# Patient Record
Sex: Female | Born: 1945 | Race: White | Hispanic: No | Marital: Married | Smoking: Never smoker
Health system: Southern US, Community
[De-identification: ages and names within clinical notes are randomized; demographics above are authoritative.]

## PROBLEM LIST (undated history)

## (undated) DIAGNOSIS — I6529 Occlusion and stenosis of unspecified carotid artery: Secondary | ICD-10-CM

## (undated) DIAGNOSIS — I639 Cerebral infarction, unspecified: Secondary | ICD-10-CM

## (undated) DIAGNOSIS — E119 Type 2 diabetes mellitus without complications: Secondary | ICD-10-CM

## (undated) HISTORY — PX: COLONOSCOPY: SHX174

## (undated) HISTORY — DX: Occlusion and stenosis of unspecified carotid artery: I65.29

---

## 1999-06-05 ENCOUNTER — Encounter: Payer: Self-pay | Admitting: Family Medicine

## 1999-06-05 ENCOUNTER — Ambulatory Visit (HOSPITAL_COMMUNITY): Admission: RE | Admit: 1999-06-05 | Discharge: 1999-06-05 | Payer: Self-pay | Admitting: Family Medicine

## 2005-06-08 ENCOUNTER — Other Ambulatory Visit: Admission: RE | Admit: 2005-06-08 | Discharge: 2005-06-08 | Payer: Self-pay | Admitting: Family Medicine

## 2005-06-15 ENCOUNTER — Ambulatory Visit (HOSPITAL_COMMUNITY): Admission: RE | Admit: 2005-06-15 | Discharge: 2005-06-15 | Payer: Self-pay | Admitting: Family Medicine

## 2006-11-09 ENCOUNTER — Ambulatory Visit (HOSPITAL_COMMUNITY): Admission: RE | Admit: 2006-11-09 | Discharge: 2006-11-09 | Payer: Self-pay | Admitting: Family Medicine

## 2006-11-23 ENCOUNTER — Encounter: Admission: RE | Admit: 2006-11-23 | Discharge: 2006-11-23 | Payer: Self-pay | Admitting: Family Medicine

## 2007-01-25 ENCOUNTER — Other Ambulatory Visit: Admission: RE | Admit: 2007-01-25 | Discharge: 2007-01-25 | Payer: Self-pay | Admitting: Family Medicine

## 2009-09-25 ENCOUNTER — Encounter: Admission: RE | Admit: 2009-09-25 | Discharge: 2009-09-25 | Payer: Self-pay | Admitting: Family Medicine

## 2009-10-28 ENCOUNTER — Other Ambulatory Visit: Admission: RE | Admit: 2009-10-28 | Discharge: 2009-10-28 | Payer: Self-pay | Admitting: Family Medicine

## 2009-11-17 ENCOUNTER — Encounter: Admission: RE | Admit: 2009-11-17 | Discharge: 2009-11-17 | Payer: Self-pay | Admitting: Family Medicine

## 2012-04-28 ENCOUNTER — Other Ambulatory Visit (HOSPITAL_COMMUNITY): Payer: Self-pay | Admitting: Family Medicine

## 2012-04-28 DIAGNOSIS — Z1231 Encounter for screening mammogram for malignant neoplasm of breast: Secondary | ICD-10-CM

## 2012-05-16 ENCOUNTER — Ambulatory Visit (HOSPITAL_COMMUNITY)
Admission: RE | Admit: 2012-05-16 | Discharge: 2012-05-16 | Disposition: A | Discharge status: Home / Self Care | Payer: BC Managed Care – PPO | Source: Ambulatory Visit | Attending: Family Medicine | Admitting: Family Medicine

## 2012-05-16 DIAGNOSIS — Z1231 Encounter for screening mammogram for malignant neoplasm of breast: Secondary | ICD-10-CM

## 2013-06-25 ENCOUNTER — Other Ambulatory Visit (HOSPITAL_COMMUNITY)
Admission: RE | Admit: 2013-06-25 | Discharge: 2013-06-25 | Disposition: A | Discharge status: Home / Self Care | Payer: Medicare Other | Source: Ambulatory Visit | Attending: Family Medicine | Admitting: Family Medicine

## 2013-06-25 ENCOUNTER — Other Ambulatory Visit: Payer: Self-pay | Admitting: Family Medicine

## 2013-06-25 DIAGNOSIS — Z124 Encounter for screening for malignant neoplasm of cervix: Secondary | ICD-10-CM | POA: Insufficient documentation

## 2015-11-14 DIAGNOSIS — W57XXXA Bitten or stung by nonvenomous insect and other nonvenomous arthropods, initial encounter: Secondary | ICD-10-CM | POA: Diagnosis not present

## 2015-11-14 DIAGNOSIS — R197 Diarrhea, unspecified: Secondary | ICD-10-CM | POA: Diagnosis not present

## 2016-01-29 DIAGNOSIS — E119 Type 2 diabetes mellitus without complications: Secondary | ICD-10-CM | POA: Diagnosis not present

## 2016-01-29 DIAGNOSIS — Z7984 Long term (current) use of oral hypoglycemic drugs: Secondary | ICD-10-CM | POA: Diagnosis not present

## 2016-01-30 DIAGNOSIS — Z7984 Long term (current) use of oral hypoglycemic drugs: Secondary | ICD-10-CM | POA: Diagnosis not present

## 2016-01-30 DIAGNOSIS — E119 Type 2 diabetes mellitus without complications: Secondary | ICD-10-CM | POA: Diagnosis not present

## 2016-03-08 DIAGNOSIS — E119 Type 2 diabetes mellitus without complications: Secondary | ICD-10-CM | POA: Diagnosis not present

## 2016-03-08 DIAGNOSIS — Z7984 Long term (current) use of oral hypoglycemic drugs: Secondary | ICD-10-CM | POA: Diagnosis not present

## 2016-05-03 DIAGNOSIS — E119 Type 2 diabetes mellitus without complications: Secondary | ICD-10-CM | POA: Diagnosis not present

## 2016-05-03 DIAGNOSIS — Z7984 Long term (current) use of oral hypoglycemic drugs: Secondary | ICD-10-CM | POA: Diagnosis not present

## 2016-09-15 DIAGNOSIS — Z6838 Body mass index (BMI) 38.0-38.9, adult: Secondary | ICD-10-CM | POA: Diagnosis not present

## 2016-09-15 DIAGNOSIS — Z0000 Encounter for general adult medical examination without abnormal findings: Secondary | ICD-10-CM | POA: Diagnosis not present

## 2016-09-15 DIAGNOSIS — E119 Type 2 diabetes mellitus without complications: Secondary | ICD-10-CM | POA: Diagnosis not present

## 2016-09-15 DIAGNOSIS — R030 Elevated blood-pressure reading, without diagnosis of hypertension: Secondary | ICD-10-CM | POA: Diagnosis not present

## 2017-03-29 DIAGNOSIS — W57XXXA Bitten or stung by nonvenomous insect and other nonvenomous arthropods, initial encounter: Secondary | ICD-10-CM | POA: Diagnosis not present

## 2017-03-29 DIAGNOSIS — S80862A Insect bite (nonvenomous), left lower leg, initial encounter: Secondary | ICD-10-CM | POA: Diagnosis not present

## 2017-04-06 DIAGNOSIS — Z6838 Body mass index (BMI) 38.0-38.9, adult: Secondary | ICD-10-CM | POA: Diagnosis not present

## 2017-04-06 DIAGNOSIS — E119 Type 2 diabetes mellitus without complications: Secondary | ICD-10-CM | POA: Diagnosis not present

## 2017-04-06 DIAGNOSIS — Z7984 Long term (current) use of oral hypoglycemic drugs: Secondary | ICD-10-CM | POA: Diagnosis not present

## 2017-04-06 DIAGNOSIS — R030 Elevated blood-pressure reading, without diagnosis of hypertension: Secondary | ICD-10-CM | POA: Diagnosis not present

## 2017-08-29 DIAGNOSIS — R05 Cough: Secondary | ICD-10-CM | POA: Diagnosis not present

## 2017-09-30 DIAGNOSIS — Z0000 Encounter for general adult medical examination without abnormal findings: Secondary | ICD-10-CM | POA: Diagnosis not present

## 2017-09-30 DIAGNOSIS — L304 Erythema intertrigo: Secondary | ICD-10-CM | POA: Diagnosis not present

## 2017-09-30 DIAGNOSIS — E119 Type 2 diabetes mellitus without complications: Secondary | ICD-10-CM | POA: Diagnosis not present

## 2017-09-30 DIAGNOSIS — Z6841 Body Mass Index (BMI) 40.0 and over, adult: Secondary | ICD-10-CM | POA: Diagnosis not present

## 2017-09-30 DIAGNOSIS — Z7984 Long term (current) use of oral hypoglycemic drugs: Secondary | ICD-10-CM | POA: Diagnosis not present

## 2017-09-30 DIAGNOSIS — R030 Elevated blood-pressure reading, without diagnosis of hypertension: Secondary | ICD-10-CM | POA: Diagnosis not present

## 2017-10-25 DIAGNOSIS — D126 Benign neoplasm of colon, unspecified: Secondary | ICD-10-CM | POA: Diagnosis not present

## 2017-10-25 DIAGNOSIS — Z800 Family history of malignant neoplasm of digestive organs: Secondary | ICD-10-CM | POA: Diagnosis not present

## 2017-10-27 ENCOUNTER — Other Ambulatory Visit: Payer: Self-pay | Admitting: Family Medicine

## 2017-10-27 DIAGNOSIS — Z1231 Encounter for screening mammogram for malignant neoplasm of breast: Secondary | ICD-10-CM

## 2017-11-09 DIAGNOSIS — Z7984 Long term (current) use of oral hypoglycemic drugs: Secondary | ICD-10-CM | POA: Diagnosis not present

## 2017-11-09 DIAGNOSIS — H2513 Age-related nuclear cataract, bilateral: Secondary | ICD-10-CM | POA: Diagnosis not present

## 2017-11-09 DIAGNOSIS — E119 Type 2 diabetes mellitus without complications: Secondary | ICD-10-CM | POA: Diagnosis not present

## 2017-11-09 DIAGNOSIS — H25013 Cortical age-related cataract, bilateral: Secondary | ICD-10-CM | POA: Diagnosis not present

## 2017-11-16 ENCOUNTER — Ambulatory Visit
Admission: RE | Admit: 2017-11-16 | Discharge: 2017-11-16 | Disposition: A | Discharge status: Home / Self Care | Payer: PPO | Source: Ambulatory Visit | Attending: Family Medicine | Admitting: Family Medicine

## 2017-11-16 DIAGNOSIS — Z1231 Encounter for screening mammogram for malignant neoplasm of breast: Secondary | ICD-10-CM | POA: Diagnosis not present

## 2017-12-07 DIAGNOSIS — K5730 Diverticulosis of large intestine without perforation or abscess without bleeding: Secondary | ICD-10-CM | POA: Diagnosis not present

## 2017-12-07 DIAGNOSIS — D126 Benign neoplasm of colon, unspecified: Secondary | ICD-10-CM | POA: Diagnosis not present

## 2017-12-07 DIAGNOSIS — Z86010 Personal history of colonic polyps: Secondary | ICD-10-CM | POA: Diagnosis not present

## 2017-12-13 DIAGNOSIS — D126 Benign neoplasm of colon, unspecified: Secondary | ICD-10-CM | POA: Diagnosis not present

## 2018-04-07 DIAGNOSIS — E119 Type 2 diabetes mellitus without complications: Secondary | ICD-10-CM | POA: Diagnosis not present

## 2018-04-07 DIAGNOSIS — Z23 Encounter for immunization: Secondary | ICD-10-CM | POA: Diagnosis not present

## 2018-04-07 DIAGNOSIS — R030 Elevated blood-pressure reading, without diagnosis of hypertension: Secondary | ICD-10-CM | POA: Diagnosis not present

## 2018-04-07 DIAGNOSIS — Z7984 Long term (current) use of oral hypoglycemic drugs: Secondary | ICD-10-CM | POA: Diagnosis not present

## 2018-10-25 DIAGNOSIS — E119 Type 2 diabetes mellitus without complications: Secondary | ICD-10-CM | POA: Diagnosis not present

## 2018-10-25 DIAGNOSIS — Z0000 Encounter for general adult medical examination without abnormal findings: Secondary | ICD-10-CM | POA: Diagnosis not present

## 2018-10-25 DIAGNOSIS — R030 Elevated blood-pressure reading, without diagnosis of hypertension: Secondary | ICD-10-CM | POA: Diagnosis not present

## 2018-10-25 DIAGNOSIS — Z6841 Body Mass Index (BMI) 40.0 and over, adult: Secondary | ICD-10-CM | POA: Diagnosis not present

## 2018-11-22 DIAGNOSIS — Z7984 Long term (current) use of oral hypoglycemic drugs: Secondary | ICD-10-CM | POA: Diagnosis not present

## 2018-11-22 DIAGNOSIS — H2513 Age-related nuclear cataract, bilateral: Secondary | ICD-10-CM | POA: Diagnosis not present

## 2018-11-22 DIAGNOSIS — E119 Type 2 diabetes mellitus without complications: Secondary | ICD-10-CM | POA: Diagnosis not present

## 2018-11-22 DIAGNOSIS — H52203 Unspecified astigmatism, bilateral: Secondary | ICD-10-CM | POA: Diagnosis not present

## 2018-11-22 DIAGNOSIS — H25013 Cortical age-related cataract, bilateral: Secondary | ICD-10-CM | POA: Diagnosis not present

## 2018-11-22 DIAGNOSIS — H524 Presbyopia: Secondary | ICD-10-CM | POA: Diagnosis not present

## 2019-04-25 DIAGNOSIS — Z7984 Long term (current) use of oral hypoglycemic drugs: Secondary | ICD-10-CM | POA: Diagnosis not present

## 2019-04-25 DIAGNOSIS — Z6839 Body mass index (BMI) 39.0-39.9, adult: Secondary | ICD-10-CM | POA: Diagnosis not present

## 2019-04-25 DIAGNOSIS — E785 Hyperlipidemia, unspecified: Secondary | ICD-10-CM | POA: Diagnosis not present

## 2019-04-25 DIAGNOSIS — I10 Essential (primary) hypertension: Secondary | ICD-10-CM | POA: Diagnosis not present

## 2019-04-25 DIAGNOSIS — E119 Type 2 diabetes mellitus without complications: Secondary | ICD-10-CM | POA: Diagnosis not present

## 2019-11-07 DIAGNOSIS — Z0000 Encounter for general adult medical examination without abnormal findings: Secondary | ICD-10-CM | POA: Diagnosis not present

## 2019-11-07 DIAGNOSIS — R030 Elevated blood-pressure reading, without diagnosis of hypertension: Secondary | ICD-10-CM | POA: Diagnosis not present

## 2019-11-07 DIAGNOSIS — Z6841 Body Mass Index (BMI) 40.0 and over, adult: Secondary | ICD-10-CM | POA: Diagnosis not present

## 2019-11-07 DIAGNOSIS — E119 Type 2 diabetes mellitus without complications: Secondary | ICD-10-CM | POA: Diagnosis not present

## 2019-11-28 DIAGNOSIS — H25813 Combined forms of age-related cataract, bilateral: Secondary | ICD-10-CM | POA: Diagnosis not present

## 2019-11-28 DIAGNOSIS — H52203 Unspecified astigmatism, bilateral: Secondary | ICD-10-CM | POA: Diagnosis not present

## 2019-11-28 DIAGNOSIS — H524 Presbyopia: Secondary | ICD-10-CM | POA: Diagnosis not present

## 2019-11-28 DIAGNOSIS — E1136 Type 2 diabetes mellitus with diabetic cataract: Secondary | ICD-10-CM | POA: Diagnosis not present

## 2020-02-15 IMAGING — MG DIGITAL SCREENING BILATERAL MAMMOGRAM WITH TOMO AND CAD
8 series · 8 of 24 positions shown · non-contrast
Comparison: Previous exam(s).

CLINICAL DATA: Screening.

EXAM:
DIGITAL SCREENING BILATERAL MAMMOGRAM WITH TOMO AND CAD

[L MLO synth-2D]
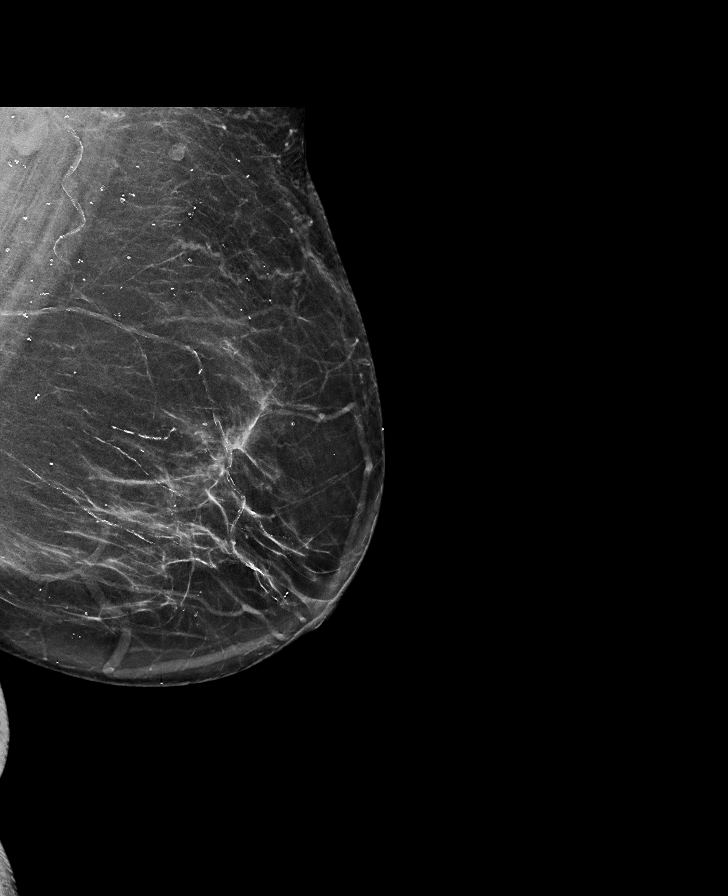

[R MLO synth-2D]
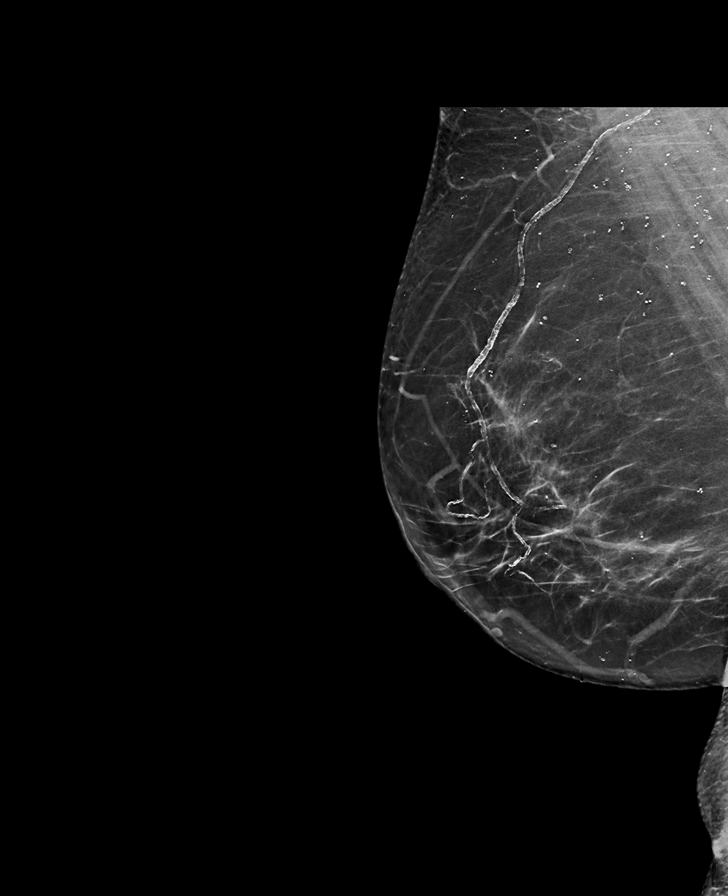

[R CC synth-2D]
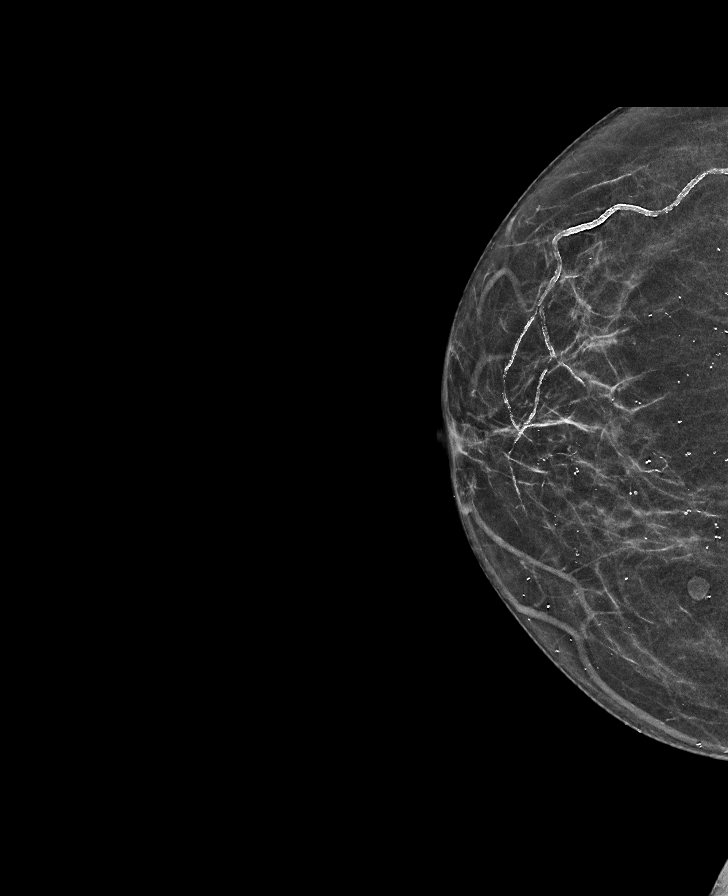

[L CC synth-2D]
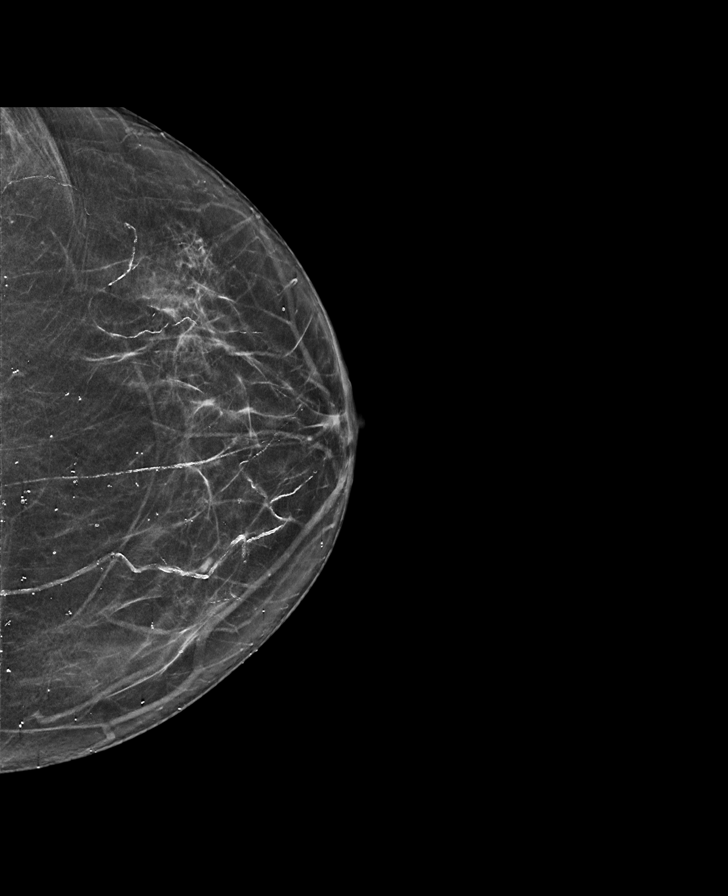

[L CC tomo · tomo slice 33/65.0]
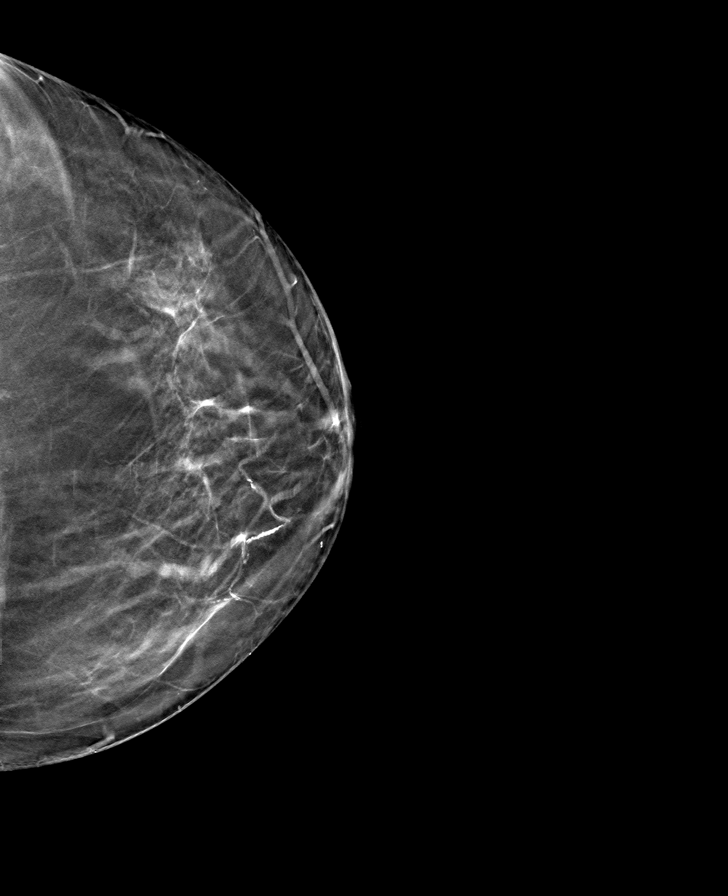

[R CC tomo · tomo slice 29/58.0]
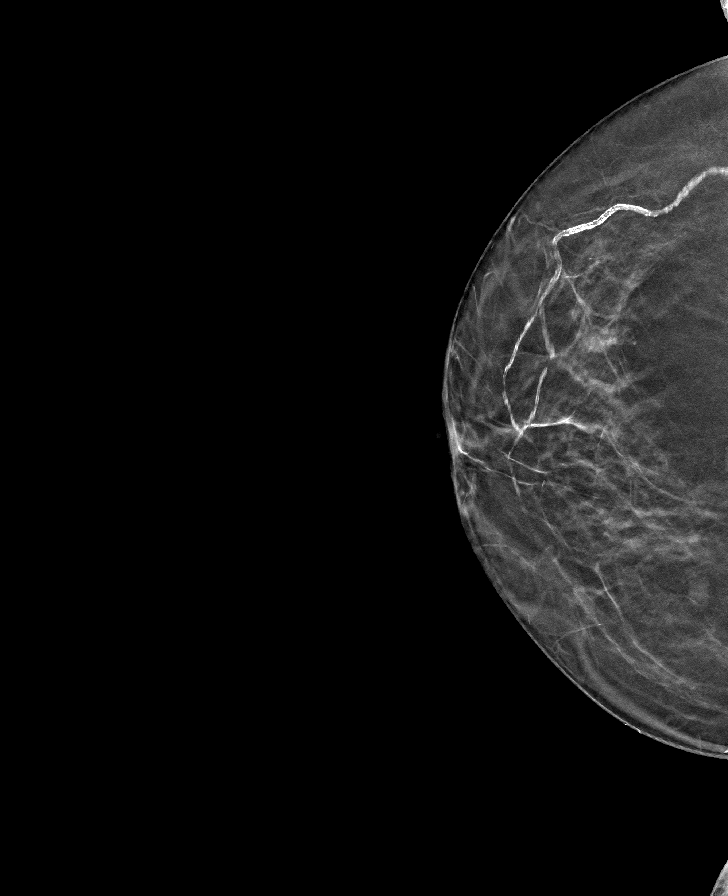

[L MLO tomo · tomo slice 39/78.0]
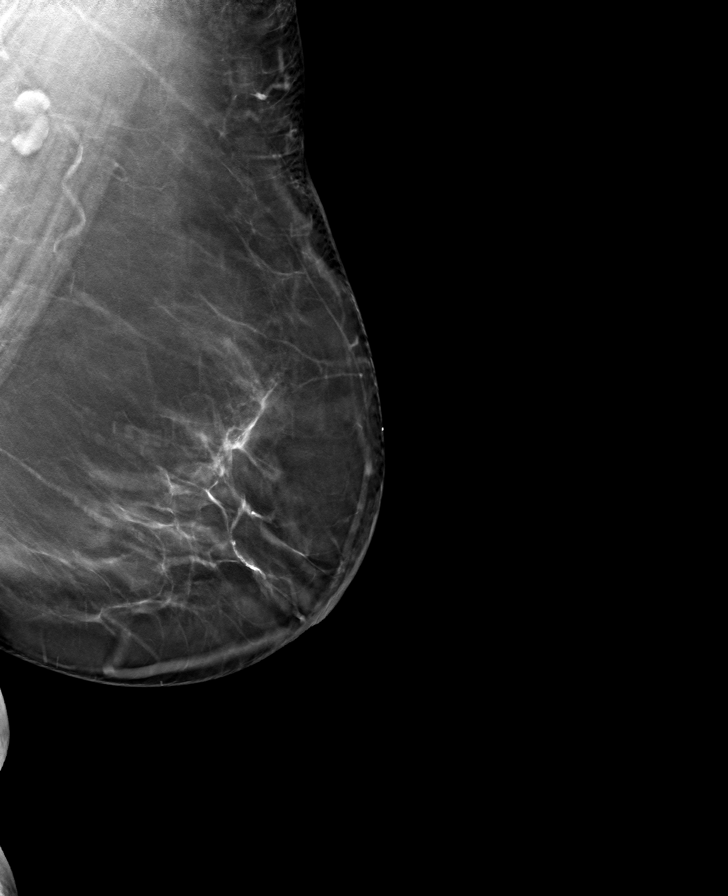

[R MLO tomo · tomo slice 37/73.0]
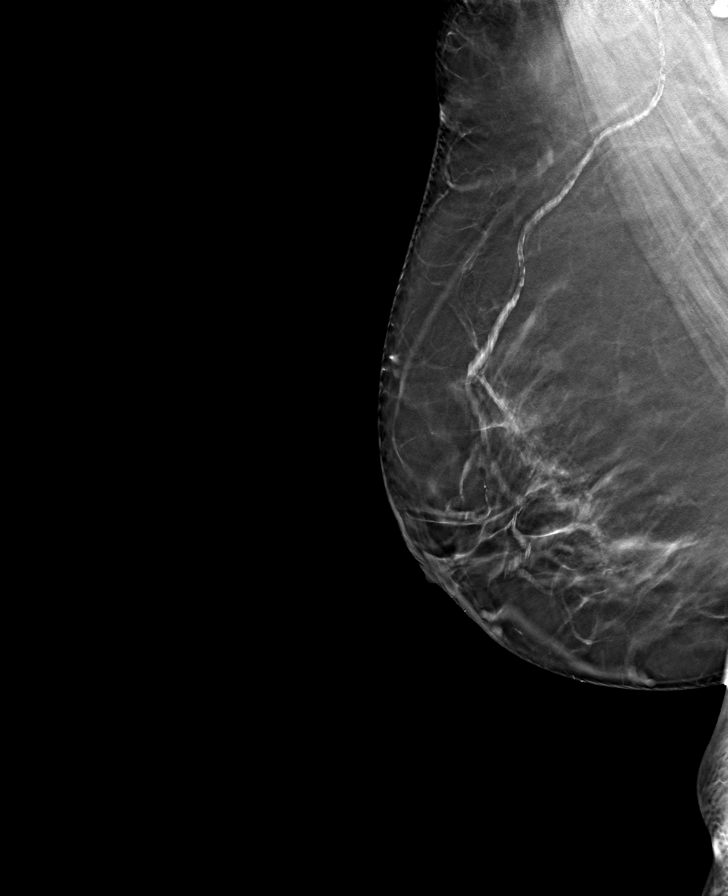

[8 of 24 positions shown; findings below may reference images not displayed]

ACR Breast Density Category b: There are scattered areas of
fibroglandular density.
FINDINGS: There are no findings suspicious for malignancy. Images were
processed with CAD.
IMPRESSION: No mammographic evidence of malignancy. A result letter of this
screening mammogram will be mailed directly to the patient.

RECOMMENDATION:
Screening mammogram in one year. (Code:[id number]-775)

BI-RADS CATEGORY  1: Negative.

## 2020-03-17 DIAGNOSIS — Z5181 Encounter for therapeutic drug level monitoring: Secondary | ICD-10-CM | POA: Diagnosis not present

## 2020-03-17 DIAGNOSIS — E119 Type 2 diabetes mellitus without complications: Secondary | ICD-10-CM | POA: Diagnosis not present

## 2020-03-21 DIAGNOSIS — Z6841 Body Mass Index (BMI) 40.0 and over, adult: Secondary | ICD-10-CM | POA: Diagnosis not present

## 2020-03-21 DIAGNOSIS — E119 Type 2 diabetes mellitus without complications: Secondary | ICD-10-CM | POA: Diagnosis not present

## 2020-03-21 DIAGNOSIS — R030 Elevated blood-pressure reading, without diagnosis of hypertension: Secondary | ICD-10-CM | POA: Diagnosis not present

## 2020-07-07 DIAGNOSIS — E119 Type 2 diabetes mellitus without complications: Secondary | ICD-10-CM | POA: Diagnosis not present

## 2020-07-07 DIAGNOSIS — Z5181 Encounter for therapeutic drug level monitoring: Secondary | ICD-10-CM | POA: Diagnosis not present

## 2020-07-14 DIAGNOSIS — E119 Type 2 diabetes mellitus without complications: Secondary | ICD-10-CM | POA: Diagnosis not present

## 2020-07-14 DIAGNOSIS — R030 Elevated blood-pressure reading, without diagnosis of hypertension: Secondary | ICD-10-CM | POA: Diagnosis not present

## 2020-08-02 DIAGNOSIS — Z03818 Encounter for observation for suspected exposure to other biological agents ruled out: Secondary | ICD-10-CM | POA: Diagnosis not present

## 2020-08-02 DIAGNOSIS — Z20822 Contact with and (suspected) exposure to covid-19: Secondary | ICD-10-CM | POA: Diagnosis not present

## 2020-11-05 DIAGNOSIS — Z5181 Encounter for therapeutic drug level monitoring: Secondary | ICD-10-CM | POA: Diagnosis not present

## 2020-11-05 DIAGNOSIS — E119 Type 2 diabetes mellitus without complications: Secondary | ICD-10-CM | POA: Diagnosis not present

## 2020-11-13 DIAGNOSIS — Z6841 Body Mass Index (BMI) 40.0 and over, adult: Secondary | ICD-10-CM | POA: Diagnosis not present

## 2020-11-13 DIAGNOSIS — R030 Elevated blood-pressure reading, without diagnosis of hypertension: Secondary | ICD-10-CM | POA: Diagnosis not present

## 2020-11-13 DIAGNOSIS — E119 Type 2 diabetes mellitus without complications: Secondary | ICD-10-CM | POA: Diagnosis not present

## 2020-11-13 DIAGNOSIS — Z0000 Encounter for general adult medical examination without abnormal findings: Secondary | ICD-10-CM | POA: Diagnosis not present

## 2020-12-03 DIAGNOSIS — H52203 Unspecified astigmatism, bilateral: Secondary | ICD-10-CM | POA: Diagnosis not present

## 2020-12-03 DIAGNOSIS — H524 Presbyopia: Secondary | ICD-10-CM | POA: Diagnosis not present

## 2020-12-03 DIAGNOSIS — H25813 Combined forms of age-related cataract, bilateral: Secondary | ICD-10-CM | POA: Diagnosis not present

## 2020-12-03 DIAGNOSIS — E119 Type 2 diabetes mellitus without complications: Secondary | ICD-10-CM | POA: Diagnosis not present

## 2021-04-06 DIAGNOSIS — Z5181 Encounter for therapeutic drug level monitoring: Secondary | ICD-10-CM | POA: Diagnosis not present

## 2021-04-06 DIAGNOSIS — Z7984 Long term (current) use of oral hypoglycemic drugs: Secondary | ICD-10-CM | POA: Diagnosis not present

## 2021-04-06 DIAGNOSIS — E119 Type 2 diabetes mellitus without complications: Secondary | ICD-10-CM | POA: Diagnosis not present

## 2021-04-09 DIAGNOSIS — E785 Hyperlipidemia, unspecified: Secondary | ICD-10-CM | POA: Diagnosis not present

## 2021-04-09 DIAGNOSIS — Z7984 Long term (current) use of oral hypoglycemic drugs: Secondary | ICD-10-CM | POA: Diagnosis not present

## 2021-04-09 DIAGNOSIS — E1165 Type 2 diabetes mellitus with hyperglycemia: Secondary | ICD-10-CM | POA: Diagnosis not present

## 2021-04-09 DIAGNOSIS — I10 Essential (primary) hypertension: Secondary | ICD-10-CM | POA: Diagnosis not present

## 2021-06-04 DIAGNOSIS — M25562 Pain in left knee: Secondary | ICD-10-CM | POA: Diagnosis not present

## 2021-06-04 DIAGNOSIS — M25571 Pain in right ankle and joints of right foot: Secondary | ICD-10-CM | POA: Diagnosis not present

## 2021-07-15 DIAGNOSIS — Z7984 Long term (current) use of oral hypoglycemic drugs: Secondary | ICD-10-CM | POA: Diagnosis not present

## 2021-07-15 DIAGNOSIS — I10 Essential (primary) hypertension: Secondary | ICD-10-CM | POA: Diagnosis not present

## 2021-07-15 DIAGNOSIS — Z6841 Body Mass Index (BMI) 40.0 and over, adult: Secondary | ICD-10-CM | POA: Diagnosis not present

## 2021-07-15 DIAGNOSIS — E1169 Type 2 diabetes mellitus with other specified complication: Secondary | ICD-10-CM | POA: Diagnosis not present

## 2021-07-15 DIAGNOSIS — E785 Hyperlipidemia, unspecified: Secondary | ICD-10-CM | POA: Diagnosis not present

## 2021-08-31 DIAGNOSIS — E1169 Type 2 diabetes mellitus with other specified complication: Secondary | ICD-10-CM | POA: Diagnosis not present

## 2021-08-31 DIAGNOSIS — I10 Essential (primary) hypertension: Secondary | ICD-10-CM | POA: Diagnosis not present

## 2021-08-31 DIAGNOSIS — Z7984 Long term (current) use of oral hypoglycemic drugs: Secondary | ICD-10-CM | POA: Diagnosis not present

## 2021-08-31 DIAGNOSIS — E785 Hyperlipidemia, unspecified: Secondary | ICD-10-CM | POA: Diagnosis not present

## 2021-09-03 DIAGNOSIS — I10 Essential (primary) hypertension: Secondary | ICD-10-CM | POA: Diagnosis not present

## 2021-09-03 DIAGNOSIS — E119 Type 2 diabetes mellitus without complications: Secondary | ICD-10-CM | POA: Diagnosis not present

## 2021-11-30 DIAGNOSIS — Z7984 Long term (current) use of oral hypoglycemic drugs: Secondary | ICD-10-CM | POA: Diagnosis not present

## 2021-11-30 DIAGNOSIS — E1169 Type 2 diabetes mellitus with other specified complication: Secondary | ICD-10-CM | POA: Diagnosis not present

## 2021-12-02 DIAGNOSIS — I10 Essential (primary) hypertension: Secondary | ICD-10-CM | POA: Diagnosis not present

## 2021-12-02 DIAGNOSIS — W19XXXA Unspecified fall, initial encounter: Secondary | ICD-10-CM | POA: Diagnosis not present

## 2021-12-02 DIAGNOSIS — Z6841 Body Mass Index (BMI) 40.0 and over, adult: Secondary | ICD-10-CM | POA: Diagnosis not present

## 2021-12-02 DIAGNOSIS — Z0000 Encounter for general adult medical examination without abnormal findings: Secondary | ICD-10-CM | POA: Diagnosis not present

## 2021-12-02 DIAGNOSIS — E1169 Type 2 diabetes mellitus with other specified complication: Secondary | ICD-10-CM | POA: Diagnosis not present

## 2021-12-10 DIAGNOSIS — H524 Presbyopia: Secondary | ICD-10-CM | POA: Diagnosis not present

## 2021-12-10 DIAGNOSIS — E1136 Type 2 diabetes mellitus with diabetic cataract: Secondary | ICD-10-CM | POA: Diagnosis not present

## 2021-12-10 DIAGNOSIS — H25813 Combined forms of age-related cataract, bilateral: Secondary | ICD-10-CM | POA: Diagnosis not present

## 2021-12-10 DIAGNOSIS — H52203 Unspecified astigmatism, bilateral: Secondary | ICD-10-CM | POA: Diagnosis not present

## 2022-02-15 DIAGNOSIS — Z1231 Encounter for screening mammogram for malignant neoplasm of breast: Secondary | ICD-10-CM | POA: Diagnosis not present

## 2022-03-01 DIAGNOSIS — E1169 Type 2 diabetes mellitus with other specified complication: Secondary | ICD-10-CM | POA: Diagnosis not present

## 2022-03-01 DIAGNOSIS — Z7984 Long term (current) use of oral hypoglycemic drugs: Secondary | ICD-10-CM | POA: Diagnosis not present

## 2022-03-01 DIAGNOSIS — E785 Hyperlipidemia, unspecified: Secondary | ICD-10-CM | POA: Diagnosis not present

## 2022-03-03 DIAGNOSIS — R928 Other abnormal and inconclusive findings on diagnostic imaging of breast: Secondary | ICD-10-CM | POA: Diagnosis not present

## 2022-03-03 DIAGNOSIS — R922 Inconclusive mammogram: Secondary | ICD-10-CM | POA: Diagnosis not present

## 2022-03-04 DIAGNOSIS — I10 Essential (primary) hypertension: Secondary | ICD-10-CM | POA: Diagnosis not present

## 2022-03-04 DIAGNOSIS — M170 Bilateral primary osteoarthritis of knee: Secondary | ICD-10-CM | POA: Diagnosis not present

## 2022-03-04 DIAGNOSIS — D649 Anemia, unspecified: Secondary | ICD-10-CM | POA: Diagnosis not present

## 2022-03-04 DIAGNOSIS — B079 Viral wart, unspecified: Secondary | ICD-10-CM | POA: Diagnosis not present

## 2022-03-04 DIAGNOSIS — E785 Hyperlipidemia, unspecified: Secondary | ICD-10-CM | POA: Diagnosis not present

## 2022-03-04 DIAGNOSIS — E1169 Type 2 diabetes mellitus with other specified complication: Secondary | ICD-10-CM | POA: Diagnosis not present

## 2022-06-07 DIAGNOSIS — E1169 Type 2 diabetes mellitus with other specified complication: Secondary | ICD-10-CM | POA: Diagnosis not present

## 2022-06-07 DIAGNOSIS — I10 Essential (primary) hypertension: Secondary | ICD-10-CM | POA: Diagnosis not present

## 2022-06-07 DIAGNOSIS — D649 Anemia, unspecified: Secondary | ICD-10-CM | POA: Diagnosis not present

## 2022-06-07 DIAGNOSIS — E785 Hyperlipidemia, unspecified: Secondary | ICD-10-CM | POA: Diagnosis not present

## 2022-06-07 DIAGNOSIS — M170 Bilateral primary osteoarthritis of knee: Secondary | ICD-10-CM | POA: Diagnosis not present

## 2022-06-10 DIAGNOSIS — E1169 Type 2 diabetes mellitus with other specified complication: Secondary | ICD-10-CM | POA: Diagnosis not present

## 2022-06-10 DIAGNOSIS — E785 Hyperlipidemia, unspecified: Secondary | ICD-10-CM | POA: Diagnosis not present

## 2022-06-10 DIAGNOSIS — I10 Essential (primary) hypertension: Secondary | ICD-10-CM | POA: Diagnosis not present

## 2022-09-13 DIAGNOSIS — E1169 Type 2 diabetes mellitus with other specified complication: Secondary | ICD-10-CM | POA: Diagnosis not present

## 2022-09-15 DIAGNOSIS — E785 Hyperlipidemia, unspecified: Secondary | ICD-10-CM | POA: Diagnosis not present

## 2022-09-15 DIAGNOSIS — I10 Essential (primary) hypertension: Secondary | ICD-10-CM | POA: Diagnosis not present

## 2022-09-15 DIAGNOSIS — Z6841 Body Mass Index (BMI) 40.0 and over, adult: Secondary | ICD-10-CM | POA: Diagnosis not present

## 2022-09-15 DIAGNOSIS — E1169 Type 2 diabetes mellitus with other specified complication: Secondary | ICD-10-CM | POA: Diagnosis not present

## 2022-11-01 ENCOUNTER — Ambulatory Visit: Payer: PPO | Admitting: Physical Therapy

## 2022-11-04 ENCOUNTER — Other Ambulatory Visit: Payer: Self-pay

## 2022-11-04 ENCOUNTER — Ambulatory Visit: Payer: PPO | Attending: Family Medicine | Admitting: Physical Therapy

## 2022-11-04 ENCOUNTER — Encounter: Payer: Self-pay | Admitting: Physical Therapy

## 2022-11-04 DIAGNOSIS — M25562 Pain in left knee: Secondary | ICD-10-CM | POA: Insufficient documentation

## 2022-11-04 DIAGNOSIS — G8929 Other chronic pain: Secondary | ICD-10-CM | POA: Insufficient documentation

## 2022-11-04 DIAGNOSIS — Y939 Activity, unspecified: Secondary | ICD-10-CM | POA: Diagnosis not present

## 2022-11-04 DIAGNOSIS — R600 Localized edema: Secondary | ICD-10-CM | POA: Diagnosis not present

## 2022-11-04 DIAGNOSIS — W19XXXA Unspecified fall, initial encounter: Secondary | ICD-10-CM | POA: Diagnosis not present

## 2022-11-04 DIAGNOSIS — Y929 Unspecified place or not applicable: Secondary | ICD-10-CM | POA: Diagnosis not present

## 2022-11-04 DIAGNOSIS — M25561 Pain in right knee: Secondary | ICD-10-CM | POA: Diagnosis not present

## 2022-11-04 DIAGNOSIS — M6281 Muscle weakness (generalized): Secondary | ICD-10-CM | POA: Diagnosis not present

## 2022-11-04 DIAGNOSIS — R262 Difficulty in walking, not elsewhere classified: Secondary | ICD-10-CM | POA: Insufficient documentation

## 2022-11-04 NOTE — Therapy (Signed)
OUTPATIENT PHYSICAL THERAPY LOWER EXTREMITY EVALUATION   Patient Name: Hailey Johnson MRN: [id number] DOB:1946-05-29, 77 y.o., female Today's Date: 11/04/2022  END OF SESSION:  PT End of Session - 11/04/22 1531     Visit Number 1    Number of Visits 16    Date for PT Re-Evaluation 01/06/23    Authorization Type [employer]    Progress Note Due on Visit 10    PT Start Time 1530    PT Stop Time 1610    PT Time Calculation (min) 40 min    Activity Tolerance Patient limited by pain    Behavior During Therapy [facility] for tasks assessed/performed             History reviewed. No pertinent past medical history. History reviewed. No pertinent surgical history. There are no problems to display for this patient.  REFERRING PROVIDER: Angelina Pih, MD  REFERRING DIAG: Unspecified fall, initial encounter [W19.XXXA]   THERAPY DIAG:  Localized edema - Plan: PT plan of care cert/re-cert  Difficulty in walking, not elsewhere classified - Plan: PT plan of care cert/re-cert  Chronic pain of left knee - Plan: PT plan of care cert/re-cert  Chronic pain of right knee - Plan: PT plan of care cert/re-cert  Muscle weakness (generalized) - Plan: PT plan of care cert/re-cert  Rationale for Evaluation and Treatment: Rehabilitation  ONSET DATE: 1.5 years ago   SUBJECTIVE:   SUBJECTIVE STATEMENT: Pt denies any recent falls, but states that she had an incident last night where her R knee (her good knee) twisted causing pain and limiting her mobility.   She states that about a year and a half ago were her most recent falls but has since been taken over by a new PCP Dr Polo Riley. Pt states that she is present at PT today due to continued instability and weakness with walking and standing. Pt reports chronic pain in bilat knees with bone on bone in L knee.   PERTINENT HISTORY: DM II, OA PAIN:  Are you having pain? Yes: NPRS scale: R knee 10 since last night's incident, L 6  /10 Pain location: Bilat Knee Pain description: Sharp, achy Aggravating factors: Walking, standing, pushing, pulling.  Relieving factors: Rest.   PRECAUTIONS: None  WEIGHT BEARING RESTRICTIONS: No  FALLS:  Has patient fallen in last 6 months? No recent falls.   LIVING ENVIRONMENT: Lives with: lives with their spouse Lives in: House/apartment Stairs: Yes: Internal: 12 steps; on right going up and External: 7 steps; on left going up Has following equipment at home: Crutches  OCCUPATION: Retired.   PLOF: Independent  PATIENT GOALS: Pt would like to get back to being independent with functional movements.    OBJECTIVE:   DIAGNOSTIC FINDINGS: None recent.   COGNITION: Overall cognitive status: Within functional limits for tasks assessed     SENSATION: WFL  POSTURE: No Significant postural limitations  PALPATION: Pt with tenderness to R knee due to bruising from incident yesterday  LOWER EXTREMITY ROM:  Active ROM Right eval Left eval  Hip flexion [facility]  Knee flexion Limited due to pain WFL  Knee extension Limited due to pain WFL   (Blank rows = not tested)  LOWER EXTREMITY MMT:  MMT Right eval Left eval  Hip flexion 4+ 4+  Knee flexion NT due to pain 4+  Knee extension NT due to pain 4-   (Blank rows = not tested)  FUNCTIONAL TESTS:  5 times sit to stand: 39.60 sec  with significant pain in her R knee. Ue of bilat UE's.   GAIT: Distance walked: 17f  Assistive device utilized: Crutches x1 Level of assistance: Complete Independence Comments: Pt ambulating with antalgic gait with 1 crutch due to significant pain in R knee due to incident last night.    TODAY'S TREATMENT:                                                                                                                              DATE: Creating, reviewing, and completing below HEP    PATIENT EDUCATION:  Education details: Educated pt on anatomy and physiology of current symptoms,  diagnosis, prognosis, and POC. Person educated: Patient Education method: ECustomer service managerEducation comprehension: verbalized understanding and returned demonstration  HOME EXERCISE PROGRAM: Not provided today due to limitations with eval due to recent incident.    ASSESSMENT:  CLINICAL IMPRESSION: Patient referred to PT for instability and balance issues. Evaluation limited due to severe pain in R knee. Pt reports twisting her knee last night when dumping out some water. 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 Patient will benefit from skilled PT to address below impairments, limitations and improve overall function.  OBJECTIVE IMPAIRMENTS: decreased activity tolerance, difficulty walking, decreased balance, decreased endurance, decreased mobility, decreased ROM, decreased strength, impaired flexibility, impaired UE/LE use, postural dysfunction, and pain.  ACTIVITY LIMITATIONS: bending, lifting, carry, locomotion, cleaning, community activity, driving, and or occupation  PERSONAL FACTORS: DM II, OA are also affecting patient's functional outcome.  REHAB POTENTIAL: Good  CLINICAL DECISION MAKING: Stable/uncomplicated  EVALUATION COMPLEXITY: Low    GOALS: Short term PT Goals Target date: 11/18/2022 Pt will be I and compliant with HEP. Baseline:  Goal status: New Pt will decrease pain by 25% overall Baseline: Goal status: New  Long term PT goals Target date: 01/06/2023 Pt will improve hip/knee strength to at least 5-/5 MMT to improve functional strength Baseline: Goal status: New Pt will reduce pain by overall 50% overall with usual activity Baseline: Goal status: New Pt will reduce pain to overall less than 2-3/10 with usual activity and work activity. Baseline: Goal status: New Pt will be able to ambulate community distances at least 1000 ft WNL gait pattern without complaints Baseline: Goal status: New    5. Pt  will improve her STS by 2.3 seconds for Minimal clinically important difference.   Baseline:  Goal status: New   PLAN: PT FREQUENCY: 1-2 times per week   PT DURATION: 8 weeks  PLANNED INTERVENTIONS (unless contraindicated): aquatic PT, Canalith repositioning, cryotherapy, Electrical stimulation, Iontophoresis with 4 mg/ml dexamethasome, Moist heat, traction, Ultrasound, gait training, Therapeutic exercise, balance training, neuromuscular re-education, patient/family education, prosthetic training, manual techniques, passive ROM, dry needling, taping, vasopnuematic device, vestibular, spinal manipulations, joint manipulations  PLAN FOR NEXT SESSION: Re-assess STS, TUG, balance screen, provide initial HEP. Update any goals as needed based on new findings.  Lynden Ang, PT 11/04/2022, 4:16 PM

## 2022-11-08 ENCOUNTER — Ambulatory Visit: Payer: PPO | Admitting: Rehabilitative and Restorative Service Providers"

## 2022-11-08 ENCOUNTER — Encounter: Payer: Self-pay | Admitting: Rehabilitative and Restorative Service Providers"

## 2022-11-08 DIAGNOSIS — M6281 Muscle weakness (generalized): Secondary | ICD-10-CM

## 2022-11-08 DIAGNOSIS — R262 Difficulty in walking, not elsewhere classified: Secondary | ICD-10-CM

## 2022-11-08 DIAGNOSIS — G8929 Other chronic pain: Secondary | ICD-10-CM

## 2022-11-08 DIAGNOSIS — R600 Localized edema: Secondary | ICD-10-CM

## 2022-11-08 NOTE — Therapy (Signed)
OUTPATIENT PHYSICAL THERAPY TREATMENT NOTE   Patient Name: Hailey Johnson MRN: [id number] DOB:[birth year], 77 y.o., female Today's Date: 11/08/2022  END OF SESSION:  PT End of Session - 11/08/22 0934     Visit Number 2    Date for PT Re-Evaluation 01/06/23    Authorization Type [employer]    Progress Note Due on Visit 10    PT Start Time 0929    PT Stop Time 1010    PT Time Calculation (min) 41 min    Activity Tolerance Patient limited by pain    Behavior During Therapy [facility] for tasks assessed/performed             History reviewed. No pertinent past medical history. History reviewed. No pertinent surgical history. There are no problems to display for this patient.  REFERRING PROVIDER: Angelina Pih, MD  REFERRING DIAG: Unspecified fall, initial encounter [W19.XXXA]   THERAPY DIAG:  Localized edema  Difficulty in walking, not elsewhere classified  Chronic pain of left knee  Chronic pain of right knee  Muscle weakness (generalized)  Rationale for Evaluation and Treatment: Rehabilitation  ONSET DATE: 1.5 years ago   SUBJECTIVE:   SUBJECTIVE STATEMENT: Pt reports that her knee is feeling some better since twisting it last week, but that she did have some pain when walking the long hallway to her [employer] classroom yesterday.  PERTINENT HISTORY: DM II, OA PAIN:  Are you having pain? Yes: NPRS scale: 5 /10 Pain location: Bilat Knee Pain description: Sharp, achy Aggravating factors: Walking, standing, pushing, pulling.  Relieving factors: Rest.   PRECAUTIONS: None  WEIGHT BEARING RESTRICTIONS: No  FALLS:  Has patient fallen in last 6 months? No recent falls.   LIVING ENVIRONMENT: Lives with: lives with their spouse Lives in: House/apartment Stairs: Yes: Internal: 12 steps; on right going up and External: 7 steps; on left going up Has following equipment at home: Crutches  OCCUPATION: Retired.   PLOF: Independent  PATIENT  GOALS: Pt would like to get back to being independent with functional movements.    OBJECTIVE:   DIAGNOSTIC FINDINGS: None recent.   COGNITION: Overall cognitive status: Within functional limits for tasks assessed     SENSATION: WFL  POSTURE: No Significant postural limitations  PALPATION: Pt with tenderness to R knee due to bruising from incident yesterday  LOWER EXTREMITY ROM:  Active ROM Right eval Left eval  Hip flexion [facility]  Knee flexion Limited due to pain WFL  Knee extension Limited due to pain WFL   (Blank rows = not tested)  LOWER EXTREMITY MMT:  MMT Right eval Left eval  Hip flexion 4+ 4+  Knee flexion NT due to pain 4+  Knee extension NT due to pain 4-   (Blank rows = not tested)  FUNCTIONAL TESTS:  Eval: 5 times sit to stand: 39.60 sec with significant pain in her R knee. Ue of bilat UE's.   GAIT: Distance walked: 8f  Assistive device utilized: Crutches x1 Level of assistance: Complete Independence Comments: Pt ambulating with antalgic gait with 1 crutch due to significant pain in R knee due to incident last night.    TODAY'S TREATMENT:  DATE: 11/08/2022 Nustep level 3 x6 min with PT present to discuss status Seated hamstring stretch 2x20 sec bilat Sitting with 1#:  heel/toe raises, marching, LAQ.  BLE 2x10 each (required towel roll in lumbar region for comfort) Seated hip adduction ball squeeze 2x10 Sit to/from stand 2x5 Tandem gait at parallel bars down and back length of barre x3 Side stepping down and back length of barre x2 Alt toe taps to 2" step without UE support 2x10 Seated hip abduction clamshells with red tband 2x10   DATE: Eval Creating, reviewing, and completing below HEP    PATIENT EDUCATION:  Education details: Educated pt on anatomy and physiology of current symptoms, diagnosis, prognosis, and  POC. Person educated: Patient Education method: Customer service manager Education comprehension: verbalized understanding and returned demonstration  HOME EXERCISE PROGRAM: Access Code: [email or web address] URL: [email or web address] Date: 11/08/2022 Prepared by: Shelby Dubin Denice Cardon  Exercises - Seated Hamstring Stretch  - 1 x daily - 7 x weekly - 1 sets - 3 reps - 30sec hold - Seated Heel Toe Raises  - 1 x daily - 7 x weekly - 2 sets - 10 reps - Seated March  - 1 x daily - 7 x weekly - 2 sets - 10 reps - Seated Long Arc Quad  - 1 x daily - 7 x weekly - 2 sets - 10 reps - Sit to Stand  - 1 x daily - 7 x weekly - 2 sets - 5 reps - Seated Hip Adduction Squeeze with Ball  - 1 x daily - 7 x weekly - 2 sets - 10 reps   ASSESSMENT:  CLINICAL IMPRESSION:  Ms Neidich presents to skilled PT reporting that she is having a bit less pain than at evaluation when she had "twisted" her knee the days before.  Patient continues to report pain with increased ambulation.  Patient was provided with HEP that was reviewed in session and then provided with a HEP for home use.  Patient did require some seated recovery periods between exercises secondary to muscle fatigue.  Patient requires minimal cuing during exercises for technique and pacing.  Patient continues to require skilled PT to progress towards HEP.   OBJECTIVE IMPAIRMENTS: decreased activity tolerance, difficulty walking, decreased balance, decreased endurance, decreased mobility, decreased ROM, decreased strength, impaired flexibility, impaired UE/LE use, postural dysfunction, and pain.  ACTIVITY LIMITATIONS: bending, lifting, carry, locomotion, cleaning, community activity, driving, and or occupation  PERSONAL FACTORS: DM II, OA are also affecting patient's functional outcome.  REHAB POTENTIAL: Good  CLINICAL DECISION MAKING: Stable/uncomplicated  EVALUATION COMPLEXITY: Low    GOALS: Short term PT Goals Target date: 11/18/2022 Pt  will be I and compliant with HEP. Baseline:  Goal status: New Pt will decrease pain by 25% overall Baseline: Goal status: New  Long term PT goals Target date: 01/06/2023 Pt will improve hip/knee strength to at least 5-/5 MMT to improve functional strength Baseline: Goal status: New Pt will reduce pain by overall 50% overall with usual activity Baseline: Goal status: New Pt will reduce pain to overall less than 2-3/10 with usual activity and work activity. Baseline: Goal status: New Pt will be able to ambulate community distances at least 1000 ft WNL gait pattern without complaints Baseline: Goal status: New    5. Pt will improve her STS by 2.3 seconds for Minimal clinically important difference.   Baseline:  Goal status: New   PLAN: PT FREQUENCY: 1-2 times per week   PT DURATION: 8 weeks  PLANNED INTERVENTIONS (unless contraindicated):  aquatic PT, Canalith repositioning, cryotherapy, Electrical stimulation, Iontophoresis with 4 mg/ml dexamethasome, Moist heat, traction, Ultrasound, gait training, Therapeutic exercise, balance training, neuromuscular re-education, patient/family education, prosthetic training, manual techniques, passive ROM, dry needling, taping, vasopnuematic device, vestibular, spinal manipulations, joint manipulations  PLAN FOR NEXT SESSION: Assess and progress HEP as indicated, strengthening, balance   Juel Burrow, PT 11/08/2022, 10:18 AM  [facility] [address], [place] [address] [place], [place] [address] Phone # [phone] Fax [phone]

## 2022-11-16 ENCOUNTER — Ambulatory Visit: Payer: PPO | Admitting: Physical Therapy

## 2022-11-16 ENCOUNTER — Encounter: Payer: Self-pay | Admitting: Physical Therapy

## 2022-11-16 DIAGNOSIS — R600 Localized edema: Secondary | ICD-10-CM

## 2022-11-16 DIAGNOSIS — M6281 Muscle weakness (generalized): Secondary | ICD-10-CM

## 2022-11-16 DIAGNOSIS — G8929 Other chronic pain: Secondary | ICD-10-CM

## 2022-11-16 NOTE — Therapy (Signed)
OUTPATIENT PHYSICAL THERAPY TREATMENT NOTE   Patient Name: Hailey Johnson MRN: [id number] DOB:[birth year], 77 y.o., female Today's Date: 11/16/2022  PCP: Angelina Pih, MD   END OF SESSION:   PT End of Session - 11/16/22 1638     Visit Number 3    Date for PT Re-Evaluation 01/06/23    Authorization Type [employer]    Progress Note Due on Visit 10    PT Start Time 1616    PT Stop Time 1700    PT Time Calculation (min) 44 min    Activity Tolerance Patient limited by pain    Behavior During Therapy [facility] for tasks assessed/performed                History reviewed. No pertinent past medical history. History reviewed. No pertinent surgical history. There are no problems to display for this patient.  REFERRING PROVIDER: Angelina Pih, MD  REFERRING DIAG: Unspecified fall, initial encounter [W19.XXXA]   THERAPY DIAG:  Localized edema  Difficulty in walking, not elsewhere classified  Chronic pain of left knee  Chronic pain of right knee  Muscle weakness (generalized)  Rationale for Evaluation and Treatment: Rehabilitation  ONSET DATE: 1.5 years ago   SUBJECTIVE:   SUBJECTIVE STATEMENT: Pt reports that her knee is a little more sore after walking around in [employer] yesterday. She has been doing more of her HEP lately.   PERTINENT HISTORY: DM II, OA PAIN:  Are you having pain? Yes: NPRS scale: 4 /10 Pain location: Bilat Knee Pain description: Sharp, achy Aggravating factors: Walking, standing, pushing, pulling.  Relieving factors: Rest.   PRECAUTIONS: None  WEIGHT BEARING RESTRICTIONS: No  FALLS:  Has patient fallen in last 6 months? No recent falls.   LIVING ENVIRONMENT: Lives with: lives with their spouse Lives in: House/apartment Stairs: Yes: Internal: 12 steps; on right going up and External: 7 steps; on left going up Has following equipment at home: Crutches  OCCUPATION: Retired.   PLOF: Independent  PATIENT GOALS: Pt  would like to get back to being independent with functional movements.    OBJECTIVE:   DIAGNOSTIC FINDINGS: None recent.   COGNITION: Overall cognitive status: Within functional limits for tasks assessed     SENSATION: WFL  POSTURE: No Significant postural limitations  PALPATION: Pt with tenderness to R knee due to bruising from incident yesterday  LOWER EXTREMITY ROM:  Active ROM Right eval Left eval  Hip flexion [facility]  Knee flexion Limited due to pain WFL  Knee extension Limited due to pain WFL   (Blank rows = not tested)  LOWER EXTREMITY MMT:  MMT Right eval Left eval  Hip flexion 4+ 4+  Knee flexion NT due to pain 4+  Knee extension NT due to pain 4-   (Blank rows = not tested)  FUNCTIONAL TESTS:  Eval: 5 times sit to stand: 39.60 sec with significant pain in her R knee. Ue of bilat UE's.   GAIT: Distance walked: 20f  Assistive device utilized: Crutches x1 Level of assistance: Complete Independence Comments: Pt ambulating with antalgic gait with 1 crutch due to significant pain in R knee due to incident last night.    TODAY'S TREATMENT:  DATE:  11/16/22 Nustep L 3 x5 min Supine bridge x10 reps Sidelying Rt clam red TB pillow between knees 2x10 Attempted Rt SLR 2x5 reps (extensor lag noted) Seated hamstring curl Rt, red TB 2x10 reps Standing heel raises x20 reps   Tandem stance Rt forward, increased pain with Lt knee forward   11/08/2022 Nustep level 3 x6 min with PT present to discuss status Seated hamstring stretch 2x20 sec bilat Sitting with 1#:  heel/toe raises, marching, LAQ.  BLE 2x10 each (required towel roll in lumbar region for comfort) Seated hip adduction ball squeeze 2x10 Sit to/from stand 2x5 Tandem gait at parallel bars down and back length of barre x3 Side stepping down and back length of barre x2 Alt toe  taps to 2" step without UE support 2x10 Seated hip abduction clamshells with red tband 2x10   DATE: Eval Creating, reviewing, and completing below HEP    PATIENT EDUCATION:  Education details: Educated pt on anatomy and physiology of current symptoms, diagnosis, prognosis, and POC. Person educated: Patient Education method: Customer service manager Education comprehension: verbalized understanding and returned demonstration  HOME EXERCISE PROGRAM: Access Code: [email or web address] URL: [email or web address] Date: 11/08/2022 Prepared by: Shelby Dubin Menke  Exercises - Seated Hamstring Stretch  - 1 x daily - 7 x weekly - 1 sets - 3 reps - 30sec hold - Seated Heel Toe Raises  - 1 x daily - 7 x weekly - 2 sets - 10 reps - Seated March  - 1 x daily - 7 x weekly - 2 sets - 10 reps - Seated Long Arc Quad  - 1 x daily - 7 x weekly - 2 sets - 10 reps - Sit to Stand  - 1 x daily - 7 x weekly - 2 sets - 5 reps - Seated Hip Adduction Squeeze with Ball  - 1 x daily - 7 x weekly - 2 sets - 10 reps   ASSESSMENT:  CLINICAL IMPRESSION:  Pt continues to have pain with ambulation, noting increase in pain since walking around [employer] yesterday. HEP adherence has increased since her last visit. Session focused on gentle LE strengthening therex. Pt has pain at end ranges of knee extension and flexion, so PT had to make some modifications to the exercises. Pt denied any overall increase in her Rt knee pain at the end of today's session.    OBJECTIVE IMPAIRMENTS: decreased activity tolerance, difficulty walking, decreased balance, decreased endurance, decreased mobility, decreased ROM, decreased strength, impaired flexibility, impaired UE/LE use, postural dysfunction, and pain.  ACTIVITY LIMITATIONS: bending, lifting, carry, locomotion, cleaning, community activity, driving, and or occupation  PERSONAL FACTORS: DM II, OA are also affecting patient's functional outcome.  REHAB POTENTIAL:  Good  CLINICAL DECISION MAKING: Stable/uncomplicated  EVALUATION COMPLEXITY: Low    GOALS: Short term PT Goals Target date: 11/18/2022 Pt will be I and compliant with HEP. Baseline:  Goal status: New Pt will decrease pain by 25% overall Baseline: Goal status: New  Long term PT goals Target date: 01/06/2023 Pt will improve hip/knee strength to at least 5-/5 MMT to improve functional strength Baseline: Goal status: New Pt will reduce pain by overall 50% overall with usual activity Baseline: Goal status: New Pt will reduce pain to overall less than 2-3/10 with usual activity and work activity. Baseline: Goal status: New Pt will be able to ambulate community distances at least 1000 ft WNL gait pattern without complaints Baseline: Goal status: New    5. Pt will improve her STS by 2.3 seconds  for Minimal clinically important difference.   Baseline:  Goal status: New   PLAN: PT FREQUENCY: 1-2 times per week   PT DURATION: 8 weeks  PLANNED INTERVENTIONS (unless contraindicated): aquatic PT, Canalith repositioning, cryotherapy, Electrical stimulation, Iontophoresis with 4 mg/ml dexamethasome, Moist heat, traction, Ultrasound, gait training, Therapeutic exercise, balance training, neuromuscular re-education, patient/family education, prosthetic training, manual techniques, passive ROM, dry needling, taping, vasopnuematic device, vestibular, spinal manipulations, joint manipulations  PLAN FOR NEXT SESSION:  progress HEP as indicated, strengthening, balance, knee ROM  5:03 PM,11/16/22 Sherol Dade PT, [place] [facility] [place]   [facility] [address] [place], [place] [address] Phone # [phone] Fax [phone]

## 2022-11-18 ENCOUNTER — Ambulatory Visit: Payer: PPO | Admitting: Physical Therapy

## 2022-11-18 ENCOUNTER — Encounter: Payer: Self-pay | Admitting: Physical Therapy

## 2022-11-18 DIAGNOSIS — G8929 Other chronic pain: Secondary | ICD-10-CM

## 2022-11-18 DIAGNOSIS — R600 Localized edema: Secondary | ICD-10-CM

## 2022-11-18 DIAGNOSIS — M6281 Muscle weakness (generalized): Secondary | ICD-10-CM

## 2022-11-18 NOTE — Therapy (Signed)
OUTPATIENT PHYSICAL THERAPY TREATMENT NOTE   Patient Name: Hailey Johnson MRN: [id number] DOB:01/28/1946, 77 y.o., female Today's Date: 11/18/2022  PCP: Angelina Pih, MD   END OF SESSION:   PT End of Session - 11/18/22 0957     Visit Number 4    Number of Visits 16    Date for PT Re-Evaluation 01/06/23    Authorization Type [employer]    Progress Note Due on Visit 10    PT Start Time 0930    PT Stop Time 1010    PT Time Calculation (min) 40 min    Activity Tolerance Patient tolerated treatment well    Behavior During Therapy [facility] for tasks assessed/performed                 History reviewed. No pertinent past medical history. History reviewed. No pertinent surgical history. There are no problems to display for this patient.  REFERRING PROVIDER: Angelina Pih, MD  REFERRING DIAG: Unspecified fall, initial encounter [W19.XXXA]   THERAPY DIAG:  Localized edema  Difficulty in walking, not elsewhere classified  Chronic pain of left knee  Chronic pain of right knee  Muscle weakness (generalized)  Rationale for Evaluation and Treatment: Rehabilitation  ONSET DATE: 1.5 years ago   SUBJECTIVE:   SUBJECTIVE STATEMENT: Pt states that she has been putting heat on her lower back and leg when it causes pain.   PERTINENT HISTORY: DM II, OA PAIN:  Are you having pain? Yes: NPRS scale: 4 /10 Pain location: Bilat Knee Pain description: Sharp, achy Aggravating factors: Walking, standing, pushing, pulling.  Relieving factors: Rest.   PRECAUTIONS: None  WEIGHT BEARING RESTRICTIONS: No  FALLS:  Has patient fallen in last 6 months? No recent falls.   LIVING ENVIRONMENT: Lives with: lives with their spouse Lives in: House/apartment Stairs: Yes: Internal: 12 steps; on right going up and External: 7 steps; on left going up Has following equipment at home: Crutches  OCCUPATION: Retired.   PLOF: Independent  PATIENT GOALS: Pt would  like to get back to being independent with functional movements.    OBJECTIVE:   DIAGNOSTIC FINDINGS: None recent.   COGNITION: Overall cognitive status: Within functional limits for tasks assessed     SENSATION: WFL  POSTURE: No Significant postural limitations  PALPATION: Pt with tenderness to R knee due to bruising from incident yesterday  LOWER EXTREMITY ROM:  Active ROM Right eval Left eval  Hip flexion [facility]  Knee flexion Limited due to pain WFL  Knee extension Limited due to pain WFL   (Blank rows = not tested)  LOWER EXTREMITY MMT:  MMT Right eval Left eval  Hip flexion 4+ 4+  Knee flexion NT due to pain 4+  Knee extension NT due to pain 4-   (Blank rows = not tested)  FUNCTIONAL TESTS:  Eval: 5 times sit to stand: 39.60 sec with significant pain in her R knee. Ue of bilat UE's.   GAIT: Distance walked: 61f  Assistive device utilized: Crutches x1 Level of assistance: Complete Independence Comments: Pt ambulating with antalgic gait with 1 crutch due to significant pain in R knee due to incident last night.    TODAY'S TREATMENT:      DATE:  11/18/22 Nustep L 3 x5 min Supine bridge x10 reps Supine adductor squeezes, 5 sec hold, x20 SAQ with pink ball x15 Sidelying Rt clam red TB 2x10 Supine marching with red TB 2x10  Seated hamstring curl Rt, red TB 2x10 reps Standing  heel raises x20 reps                                                                                                          DATE:  11/16/22 Nustep L 3 x5 min Supine bridge x10 reps Sidelying Rt clam red TB pillow between knees 2x10 Attempted Rt SLR 2x5 reps (extensor lag noted) Seated hamstring curl Rt, red TB 2x10 reps Standing heel raises x20 reps   Tandem stance Rt forward, increased pain with Lt knee forward   11/08/2022 Nustep level 3 x6 min with PT present to discuss status Seated hamstring stretch 2x20 sec bilat Sitting with 1#:  heel/toe raises, marching, LAQ.   BLE 2x10 each (required towel roll in lumbar region for comfort) Seated hip adduction ball squeeze 2x10 Sit to/from stand 2x5 Tandem gait at parallel bars down and back length of barre x3 Side stepping down and back length of barre x2 Alt toe taps to 2" step without UE support 2x10 Seated hip abduction clamshells with red tband 2x10   DATE: Eval Creating, reviewing, and completing below HEP    PATIENT EDUCATION:  Education details: Educated pt on anatomy and physiology of current symptoms, diagnosis, prognosis, and POC. Person educated: Patient Education method: Customer service manager Education comprehension: verbalized understanding and returned demonstration  HOME EXERCISE PROGRAM: Access Code: [email or web address] URL: [email or web address] Date: 11/08/2022 Prepared by: Shelby Dubin Menke  Exercises - Seated Hamstring Stretch  - 1 x daily - 7 x weekly - 1 sets - 3 reps - 30sec hold - Seated Heel Toe Raises  - 1 x daily - 7 x weekly - 2 sets - 10 reps - Seated March  - 1 x daily - 7 x weekly - 2 sets - 10 reps - Seated Long Arc Quad  - 1 x daily - 7 x weekly - 2 sets - 10 reps - Sit to Stand  - 1 x daily - 7 x weekly - 2 sets - 5 reps - Seated Hip Adduction Squeeze with Ball  - 1 x daily - 7 x weekly - 2 sets - 10 reps   ASSESSMENT:  CLINICAL IMPRESSION:  Pt continues to have pain with walking and intermittent pain in R knee with bridges. Session focused on gentle LE strengthening therex. Pt has pain at end ranges of knee extension and flexion, so PT had to make some modifications to the exercises. She tolerates SAQ with no pain, but noted fatigue. Pt denied any overall increase in her Rt knee pain at the end of today's session. Plan to progress pt as tolerated to improve ROM and strength in bilat LE's.    OBJECTIVE IMPAIRMENTS: decreased activity tolerance, difficulty walking, decreased balance, decreased endurance, decreased mobility, decreased ROM, decreased strength,  impaired flexibility, impaired UE/LE use, postural dysfunction, and pain.  ACTIVITY LIMITATIONS: bending, lifting, carry, locomotion, cleaning, community activity, driving, and or occupation  PERSONAL FACTORS: DM II, OA are also affecting patient's functional outcome.  REHAB POTENTIAL: Good  CLINICAL DECISION MAKING: Stable/uncomplicated  EVALUATION COMPLEXITY: Low    GOALS: Short  term PT Goals Target date: 11/18/2022 Pt will be I and compliant with HEP. Baseline:  Goal status: New Pt will decrease pain by 25% overall Baseline: Goal status: New  Long term PT goals Target date: 01/06/2023 Pt will improve hip/knee strength to at least 5-/5 MMT to improve functional strength Baseline: Goal status: New Pt will reduce pain by overall 50% overall with usual activity Baseline: Goal status: New Pt will reduce pain to overall less than 2-3/10 with usual activity and work activity. Baseline: Goal status: New Pt will be able to ambulate community distances at least 1000 ft WNL gait pattern without complaints Baseline: Goal status: New    5. Pt will improve her STS by 2.3 seconds for Minimal clinically important difference.   Baseline:  Goal status: New   PLAN: PT FREQUENCY: 1-2 times per week   PT DURATION: 8 weeks  PLANNED INTERVENTIONS (unless contraindicated): aquatic PT, Canalith repositioning, cryotherapy, Electrical stimulation, Iontophoresis with 4 mg/ml dexamethasome, Moist heat, traction, Ultrasound, gait training, Therapeutic exercise, balance training, neuromuscular re-education, patient/family education, prosthetic training, manual techniques, passive ROM, dry needling, taping, vasopnuematic device, vestibular, spinal manipulations, joint manipulations  PLAN FOR NEXT SESSION:  progress HEP as indicated, strengthening, balance, knee ROM  Rudi Heap PT, DPT 11/18/22  12:20 PM     [facility] [address], [place],   [address] Phone # [phone] Fax [phone]

## 2022-11-24 ENCOUNTER — Ambulatory Visit: Payer: PPO | Admitting: Rehabilitative and Restorative Service Providers"

## 2022-11-24 ENCOUNTER — Encounter: Payer: Self-pay | Admitting: Rehabilitative and Restorative Service Providers"

## 2022-11-24 DIAGNOSIS — R262 Difficulty in walking, not elsewhere classified: Secondary | ICD-10-CM

## 2022-11-24 DIAGNOSIS — R600 Localized edema: Secondary | ICD-10-CM

## 2022-11-24 DIAGNOSIS — G8929 Other chronic pain: Secondary | ICD-10-CM | POA: Diagnosis not present

## 2022-11-24 DIAGNOSIS — M6281 Muscle weakness (generalized): Secondary | ICD-10-CM

## 2022-11-24 NOTE — Therapy (Signed)
OUTPATIENT PHYSICAL THERAPY TREATMENT NOTE   Patient Name: Hailey Johnson MRN: [id number] DOB:1945-11-23, 77 y.o., female Today's Date: 11/24/2022  PCP: Angelina Pih, MD   END OF SESSION:   PT End of Session - 11/24/22 1127     Visit Number 5    Date for PT Re-Evaluation 01/06/23    Authorization Type [employer]    Progress Note Due on Visit 10    PT Start Time 1125    PT Stop Time 1205    PT Time Calculation (min) 40 min    Activity Tolerance Patient tolerated treatment well    Behavior During Therapy [facility] for tasks assessed/performed                 History reviewed. No pertinent past medical history. History reviewed. No pertinent surgical history. There are no problems to display for this patient.  REFERRING PROVIDER: Angelina Pih, MD  REFERRING DIAG: Unspecified fall, initial encounter [W19.XXXA]   THERAPY DIAG:  Localized edema  Difficulty in walking, not elsewhere classified  Chronic pain of left knee  Chronic pain of right knee  Muscle weakness (generalized)  Rationale for Evaluation and Treatment: Rehabilitation  ONSET DATE: 1.5 years ago   SUBJECTIVE:   SUBJECTIVE STATEMENT: Pt states that sometimes she feels like her knee is going to "give out" when she is walking.  PERTINENT HISTORY: DM II, OA PAIN:  Are you having pain? Yes: NPRS scale: 2-3/10 Pain location: Bilat Knee Pain description: Sharp, achy Aggravating factors: Walking, standing, pushing, pulling.  Relieving factors: Rest.   PRECAUTIONS: None  WEIGHT BEARING RESTRICTIONS: No  FALLS:  Has patient fallen in last 6 months? No recent falls.   LIVING ENVIRONMENT: Lives with: lives with their spouse Lives in: House/apartment Stairs: Yes: Internal: 12 steps; on right going up and External: 7 steps; on left going up Has following equipment at home: Crutches  OCCUPATION: Retired.   PLOF: Independent  PATIENT GOALS: Pt would like to get back to  being independent with functional movements.    OBJECTIVE:   DIAGNOSTIC FINDINGS: None recent.   COGNITION: Overall cognitive status: Within functional limits for tasks assessed     SENSATION: WFL  POSTURE: No Significant postural limitations  PALPATION: Pt with tenderness to R knee due to bruising from incident yesterday  LOWER EXTREMITY ROM:  Active ROM Right eval Left eval  Hip flexion [facility]  Knee flexion Limited due to pain WFL  Knee extension Limited due to pain WFL   (Blank rows = not tested)  LOWER EXTREMITY MMT:  MMT Right eval Left eval  Hip flexion 4+ 4+  Knee flexion NT due to pain 4+  Knee extension NT due to pain 4-   (Blank rows = not tested)  FUNCTIONAL TESTS:  Eval: 5 times sit to stand: 39.60 sec with significant pain in her R knee. Ue of bilat UE's.   GAIT: Distance walked: 79f  Assistive device utilized: Crutches x1 Level of assistance: Complete Independence Comments: Pt ambulating with antalgic gait with 1 crutch due to significant pain in R knee due to incident last night.    TODAY'S TREATMENT:       DATE: 11/24/2022 Nustep level 4 x6 min with PT present to discuss status Sit to/from stand x10 Seated hamstring stretch 2x20 sec bilat Left Sidelying clamshell 2x10  Supine bridge 2x10 Supine SAQ with pink ball 2x10 bilat Supine hip adduction with pink ball 2x10 Supine hamstring stretch with strap 2x20 sec RLE Manual  Therapy:  side-lying on left side utilized Addaday to right glutes/piriformis, IT band, hamstring, calf Standing at counter:  heel raises, marching, hip abduction, hip extension.  X10 bilat   DATE: 11/18/22 Nustep L 3 x5 min Supine bridge x10 reps Supine adductor squeezes, 5 sec hold, x20 SAQ with pink ball x15 Sidelying Rt clam red TB 2x10 Supine marching with red TB 2x10  Seated hamstring curl Rt, red TB 2x10 reps Standing heel raises x20 reps                                                                                                           DATE: 11/16/22 Nustep L 3 x5 min Supine bridge x10 reps Sidelying Rt clam red TB pillow between knees 2x10 Attempted Rt SLR 2x5 reps (extensor lag noted) Seated hamstring curl Rt, red TB 2x10 reps Standing heel raises x20 reps   Tandem stance Rt forward, increased pain with Lt knee forward    PATIENT EDUCATION:  Education details: Educated pt on anatomy and physiology of current symptoms, diagnosis, prognosis, and POC. Person educated: Patient Education method: Customer service manager Education comprehension: verbalized understanding and returned demonstration  HOME EXERCISE PROGRAM: Access Code: [email or web address] URL: [email or web address] Date: 11/24/2022 Prepared by: Shelby Dubin Alyaan Budzynski  Exercises - Seated Hamstring Stretch  - 1 x daily - 7 x weekly - 1 sets - 3 reps - 30sec hold - Seated Heel Toe Raises  - 1 x daily - 7 x weekly - 2 sets - 10 reps - Seated March  - 1 x daily - 7 x weekly - 2 sets - 10 reps - Seated Long Arc Quad  - 1 x daily - 7 x weekly - 2 sets - 10 reps - Sit to Stand  - 1 x daily - 7 x weekly - 2 sets - 5 reps - Seated Hip Adduction Squeeze with Ball  - 1 x daily - 7 x weekly - 2 sets - 10 reps - Clamshell  - 1 x daily - 7 x weekly - 2 sets - 10 reps - Supine Bridge  - 1 x daily - 7 x weekly - 2 sets - 10 reps - Standing Hip Abduction with Counter Support  - 1 x daily - 7 x weekly - 2 sets - 10 reps - Standing Hip Extension with Counter Support  - 1 x daily - 7 x weekly - 2 sets - 10 reps - Standing March with Counter Support  - 1 x daily - 7 x weekly - 2 sets - 10 reps - Heel Raises with Counter Support  - 1 x daily - 7 x weekly - 2 sets - 10 reps   ASSESSMENT:  CLINICAL IMPRESSION:  Ms Tippen presents to skilled PT with reports of soreness/pain in RLE.  Patient reports feeling tightness in hamstring, calf, and IT band.  Following stretching and manual therapy with Addaday, patient reported decreased tightness.   Patient did have some difficulty with standing exercises, especially when she had to stand on RLE, but was able to complete.  Provided pt with updated HEP to assist with further strengthening at home and advised pt that she could perform only one set, if needed and break up, as needed.  Pt verbalized her understanding.   OBJECTIVE IMPAIRMENTS: decreased activity tolerance, difficulty walking, decreased balance, decreased endurance, decreased mobility, decreased ROM, decreased strength, impaired flexibility, impaired UE/LE use, postural dysfunction, and pain.  ACTIVITY LIMITATIONS: bending, lifting, carry, locomotion, cleaning, community activity, driving, and or occupation  PERSONAL FACTORS: DM II, OA are also affecting patient's functional outcome.  REHAB POTENTIAL: Good  CLINICAL DECISION MAKING: Stable/uncomplicated  EVALUATION COMPLEXITY: Low    GOALS: Short term PT Goals Target date: 11/18/2022 Pt will be I and compliant with HEP. Baseline:  Goal status: MET on 11/24/2022  Pt will decrease pain by 25% overall Baseline: Goal status: New  Long term PT goals Target date: 01/06/2023 Pt will improve hip/knee strength to at least 5-/5 MMT to improve functional strength Baseline: Goal status: New  Pt will reduce pain by overall 50% overall with usual activity Baseline: Goal status: New  Pt will reduce pain to overall less than 2-3/10 with usual activity and work activity. Baseline: Goal status: New  Pt will be able to ambulate community distances at least 1000 ft WNL gait pattern without complaints Baseline: Goal status: New     5. Pt will improve her STS by 2.3 seconds for Minimal clinically important difference.   Baseline:  Goal status: New   PLAN: PT FREQUENCY: 1-2 times per week   PT DURATION: 8 weeks  PLANNED INTERVENTIONS (unless contraindicated): aquatic PT, Canalith repositioning, cryotherapy, Electrical stimulation, Iontophoresis with 4 mg/ml  dexamethasome, Moist heat, traction, Ultrasound, gait training, Therapeutic exercise, balance training, neuromuscular re-education, patient/family education, prosthetic training, manual techniques, passive ROM, dry needling, taping, vasopnuematic device, vestibular, spinal manipulations, joint manipulations  PLAN FOR NEXT SESSION:  progress HEP as indicated, strengthening, balance, knee ROM, manual therapy as indicated    Juel Burrow, PT, DPT 11/24/22  12:17 PM   [facility] [address] [place], [place] [address] Phone # [phone] Fax [phone]

## 2022-11-25 NOTE — Therapy (Signed)
OUTPATIENT PHYSICAL THERAPY TREATMENT NOTE   Patient Name: Hailey Hailey MRN: [id number] DOB:[birth year], 77 y.o., female Today's Date: 11/26/2022  PCP: Hailey Pih, MD   END OF SESSION:   PT End of Session - 11/26/22 1026     Visit Number 6    Date for PT Re-Evaluation 01/06/23    Authorization Type [employer]    Progress Note Due on Visit 10    PT Start Time 1021    PT Stop Time 1100    PT Time Calculation (min) 39 min    Activity Tolerance Patient tolerated treatment well;Patient limited by pain    Behavior During Therapy Hailey Hailey for tasks assessed/performed                  History reviewed. No pertinent past medical history. History reviewed. No pertinent surgical history. There are no problems to display for this patient.  REFERRING PROVIDER: Angelina Pih, MD  REFERRING DIAG: Unspecified fall, initial encounter [W19.XXXA]   THERAPY DIAG:  Localized edema  Difficulty in walking, not elsewhere classified  Chronic pain of left knee  Chronic pain of right knee  Muscle weakness (generalized)  Rationale for Evaluation and Treatment: Rehabilitation  ONSET DATE: 1.5 years ago   SUBJECTIVE:   SUBJECTIVE STATEMENT: Pt states that her single leg standing exercises are causing a lot of pain. She does feel that her knee is moving better and she is improving overall.   PERTINENT HISTORY: DM II, OA PAIN:  Are you having pain? Yes: NPRS scale: 2-3/10 Pain location: Bilat Knee Pain description: Sharp, achy Aggravating factors: Walking, standing, pushing, pulling.  Relieving factors: Rest.   PRECAUTIONS: None  WEIGHT BEARING RESTRICTIONS: No  FALLS:  Has patient fallen in last 6 months? No recent falls.   LIVING ENVIRONMENT: Lives with: lives with their spouse Lives in: House/apartment Stairs: Yes: Internal: 12 steps; on right going up and External: 7 steps; on left going up Has following equipment at home:  Crutches  OCCUPATION: Retired.   PLOF: Independent  PATIENT GOALS: Pt would like to get back to being independent with functional movements.    OBJECTIVE:   DIAGNOSTIC FINDINGS: None recent.   COGNITION: Overall cognitive status: Within functional limits for tasks assessed     SENSATION: WFL  POSTURE: No Significant postural limitations  PALPATION: Pt with tenderness to R knee due to bruising from incident yesterday  LOWER EXTREMITY ROM:  Active ROM Right eval Left eval  Hip flexion Hailey Hailey Hailey Hailey  Knee flexion Limited due to pain WFL  Knee extension Limited due to pain WFL   (Blank rows = not tested)  LOWER EXTREMITY MMT:  MMT Right eval Left eval  Hip flexion 4+ 4+  Knee flexion NT due to pain 4+  Knee extension NT due to pain 4-   (Blank rows = not tested)  FUNCTIONAL TESTS:  Eval: 5 times sit to stand: 39.60 sec with significant pain in her R knee. Ue of bilat UE's.  5x sit to stand: 14 sec, no UE  GAIT: Distance walked: 70f  Assistive device utilized: none Level of assistance: Complete Independence Comments: Pt states that she uses 1 crutch when using stairs   TODAY'S TREATMENT:       DATE: 11/26/22 Nustep L4 x5 min PT present to discuss issues with HEP Sidestepping yellow TB around ankles 2x10 reps with seated rest between sets Standing hip extension with lean onto forearms 2x5 reps  5x sit to stand 14 sec Discussion of  HEP and updates made this visit Gastroc stretch against wall, Rt only-pt did not feel a good stretch with this Manual: STM Rt gastroc/peroneals   11/24/2022 Nustep level 4 x6 min with PT present to discuss status Sit to/from stand x10 Seated hamstring stretch 2x20 sec bilat Left Sidelying clamshell 2x10  Supine bridge 2x10 Supine SAQ with pink ball 2x10 bilat Supine hip adduction with pink ball 2x10 Supine hamstring stretch with strap 2x20 sec RLE Manual Therapy:  side-lying on left side utilized Addaday to right  glutes/piriformis, IT band, hamstring, calf Standing at counter:  heel raises, marching, hip abduction, hip extension.  X10 bilat   DATE: 11/18/22 Nustep L 3 x5 min Supine bridge x10 reps Supine adductor squeezes, 5 sec hold, x20 SAQ with pink ball x15 Sidelying Rt clam red TB 2x10 Supine marching with red TB 2x10  Seated hamstring curl Rt, red TB 2x10 reps Standing heel raises x20 reps                                                                                                            PATIENT EDUCATION:  Education details: updates to HEP Person educated: Patient Education method: Customer service manager Education comprehension: verbalized understanding and returned demonstration  HOME EXERCISE PROGRAM: Access Code: [email or web address] URL: [email or web address] Date: 11/26/2022 Prepared by: Hailey Hailey  Exercises - Seated Hamstring Stretch  - 1 x daily - 7 x weekly - 1 sets - 3 reps - 30sec hold - Sit to Stand  - 1 x daily - 7 x weekly - 2 sets - 5 reps - Seated Hip Adduction Squeeze with Ball  - 1 x daily - 7 x weekly - 2 sets - 10 reps - Clamshell  - 1 x daily - 7 x weekly - 2 sets - 10 reps - Standing Hip Extension with Counter Support  - 1 x daily - 7 x weekly - 3 sets - 5 reps - Standing March with Counter Support  - 1 x daily - 7 x weekly - 2 sets - 10 reps - Heel Raises with Counter Support  - 1 x daily - 7 x weekly - 2 sets - 10 reps - Side Stepping with Resistance at Ankles and Counter Support  - 1 x daily - 7 x weekly - 2 sets - 10 reps   ASSESSMENT:  CLINICAL IMPRESSION:  Pt is overall making good progress towards her goals, decreasing 5x sit to stand time to 14 seconds today. Pt had some concerns about the standing exercises on her HEP, so PT reviewed these and made some modifications. Pt was able to complete the modifications without significant knee pain. Ended with soft tissue mobilization to the Rt  peroneal/gastroc to decrease muscle spasm. Pt had several trigger points in this region, likely due to changes in mechanics with walking and other ADLs. PT provided dry needling info.   OBJECTIVE IMPAIRMENTS: decreased activity tolerance, difficulty walking, decreased balance, decreased endurance, decreased mobility, decreased ROM, decreased strength, impaired flexibility, impaired UE/LE  use, postural dysfunction, and pain.  ACTIVITY LIMITATIONS: bending, lifting, carry, locomotion, cleaning, community activity, driving, and or occupation  PERSONAL FACTORS: DM II, OA are also affecting patient's functional outcome.  REHAB POTENTIAL: Good  CLINICAL DECISION MAKING: Stable/uncomplicated  EVALUATION COMPLEXITY: Low    GOALS: Short term PT Goals Target date: 11/18/2022 Pt will be I and compliant with HEP. Baseline:  Goal status: MET on 11/24/2022  Pt will decrease pain by 25% overall Baseline: Goal status: New  Long term PT goals Target date: 01/06/2023 Pt will improve hip/knee strength to at least 5-/5 MMT to improve functional strength Baseline: Goal status: New  Pt will reduce pain by overall 50% overall with usual activity Baseline: Goal status: New  Pt will reduce pain to overall less than 2-3/10 with usual activity and work activity. Baseline: Goal status: New  Pt will be able to ambulate community distances at least 1000 ft WNL gait pattern without complaints Baseline: Goal status: New     5. Pt will improve her STS by 2.3 seconds for Minimal clinically important difference.   Baseline:  Goal status: New   PLAN: PT FREQUENCY: 1-2 times per week   PT DURATION: 8 weeks  PLANNED INTERVENTIONS (unless contraindicated): aquatic PT, Canalith repositioning, cryotherapy, Electrical stimulation, Iontophoresis with 4 mg/ml dexamethasome, Moist heat, traction, Ultrasound, gait training, Therapeutic exercise, balance training, neuromuscular re-education, patient/family  education, prosthetic training, manual techniques, passive ROM, dry needling, taping, vasopnuematic device, vestibular, spinal manipulations, joint manipulations  PLAN FOR NEXT SESSION:  possible DN to gastroc region; progress HEP as indicated, strengthening, balance, knee ROM, manual therapy as indicated    11:10 AM,11/26/22 Sherol Dade PT, DPT [place] [facility]

## 2022-11-26 ENCOUNTER — Encounter: Payer: Self-pay | Admitting: Physical Therapy

## 2022-11-26 ENCOUNTER — Ambulatory Visit: Payer: PPO | Attending: Family Medicine | Admitting: Physical Therapy

## 2022-11-26 DIAGNOSIS — R600 Localized edema: Secondary | ICD-10-CM | POA: Diagnosis not present

## 2022-11-26 DIAGNOSIS — M6281 Muscle weakness (generalized): Secondary | ICD-10-CM

## 2022-11-26 DIAGNOSIS — M25561 Pain in right knee: Secondary | ICD-10-CM | POA: Diagnosis not present

## 2022-11-26 DIAGNOSIS — G8929 Other chronic pain: Secondary | ICD-10-CM | POA: Diagnosis not present

## 2022-11-26 DIAGNOSIS — R262 Difficulty in walking, not elsewhere classified: Secondary | ICD-10-CM | POA: Insufficient documentation

## 2022-11-26 DIAGNOSIS — M25562 Pain in left knee: Secondary | ICD-10-CM | POA: Insufficient documentation

## 2022-11-30 NOTE — Therapy (Signed)
OUTPATIENT PHYSICAL THERAPY TREATMENT NOTE   Patient Name: Hailey Johnson MRN: [id number] DOB:September 15, 1946, 77 y.o., female Today's Date: 12/01/2022  PCP: Angelina Pih, MD   END OF SESSION:   PT End of Session - 12/01/22 1019     Visit Number 7    Date for PT Re-Evaluation 01/06/23    Authorization Type [employer]    Progress Note Due on Visit 10    PT Start Time 1017    PT Stop Time 1055    PT Time Calculation (min) 38 min    Activity Tolerance Patient tolerated treatment well;No increased pain    Behavior During Therapy [facility] for tasks assessed/performed                   History reviewed. No pertinent past medical history. History reviewed. No pertinent surgical history. There are no problems to display for this patient.  REFERRING PROVIDER: Angelina Pih, MD  REFERRING DIAG: Unspecified fall, initial encounter [W19.XXXA]   THERAPY DIAG:  Localized edema  Difficulty in walking, not elsewhere classified  Chronic pain of left knee  Chronic pain of right knee  Muscle weakness (generalized)  Rationale for Evaluation and Treatment: Rehabilitation  ONSET DATE: 1.5 years ago   SUBJECTIVE:   SUBJECTIVE STATEMENT: Pt states that she did better with her standing exercises. She is having Rt hip pain down to the knee and sometimes the ankle that seems to be worse with standing and lying on her side.   PERTINENT HISTORY: DM II, OA PAIN:  Are you having pain? Yes: NPRS scale: 2-3/10 Pain location: Bilat Knee Pain description: Sharp, achy Aggravating factors: Walking, standing, pushing, pulling.  Relieving factors: Rest.   PRECAUTIONS: None  WEIGHT BEARING RESTRICTIONS: No  FALLS:  Has patient fallen in last 6 months? No recent falls.   LIVING ENVIRONMENT: Lives with: lives with their spouse Lives in: House/apartment Stairs: Yes: Internal: 12 steps; on right going up and External: 7 steps; on left going up Has following  equipment at home: Crutches  OCCUPATION: Retired.   PLOF: Independent  PATIENT GOALS: Pt would like to get back to being independent with functional movements.    OBJECTIVE:   DIAGNOSTIC FINDINGS: None recent.   COGNITION: Overall cognitive status: Within functional limits for tasks assessed     SENSATION: WFL  POSTURE: No Significant postural limitations  PALPATION: Pt with tenderness to R knee due to bruising from incident yesterday  LOWER EXTREMITY ROM:  Active ROM Right eval Left eval  Hip flexion [facility]  Knee flexion Limited due to pain WFL  Knee extension Limited due to pain WFL   (Blank rows = not tested)  LOWER EXTREMITY MMT:  MMT Right eval Left eval  Hip flexion 4+ 4+  Knee flexion NT due to pain 4+  Knee extension NT due to pain 4-   (Blank rows = not tested)  FUNCTIONAL TESTS:  Eval: 5 times sit to stand: 39.60 sec with significant pain in her R knee. Ue of bilat UE's.  5x sit to stand: 14 sec, no UE  GAIT: Distance walked: 22f  Assistive device utilized: none Level of assistance: Complete Independence Comments: Pt states that she uses 1 crutch when using stairs   TODAY'S TREATMENT:       DATE:  12/01/22 Reviewed HEP and verbal instructions/questions answered Supine piriformis stretch 2x20 sec figure 4 and knee to opposite shoulder Manual treatment: STM Rt gluteals, Rt TFL, Rt lateral hamstring/quad, Rt peroneals; addaday to  the previously listed areas  11/26/22 Nustep L4 x5 min PT present to discuss issues with HEP Sidestepping yellow TB around ankles 2x10 reps with seated rest between sets Standing hip extension with lean onto forearms 2x5 reps  5x sit to stand 14 sec Discussion of HEP and updates made this visit Gastroc stretch against wall, Rt only-pt did not feel a good stretch with this Manual: STM Rt gastroc/peroneals   11/24/2022 Nustep level 4 x6 min with PT present to discuss status Sit to/from stand x10 Seated hamstring  stretch 2x20 sec bilat Left Sidelying clamshell 2x10  Supine bridge 2x10 Supine SAQ with pink ball 2x10 bilat Supine hip adduction with pink ball 2x10 Supine hamstring stretch with strap 2x20 sec RLE Manual Therapy:  side-lying on left side utilized Addaday to right glutes/piriformis, IT band, hamstring, calf Standing at counter:  heel raises, marching, hip abduction, hip extension.  X10 bilat   DATE: 11/18/22 Nustep L 3 x5 min Supine bridge x10 reps Supine adductor squeezes, 5 sec hold, x20 SAQ with pink ball x15 Sidelying Rt clam red TB 2x10 Supine marching with red TB 2x10  Seated hamstring curl Rt, red TB 2x10 reps Standing heel raises x20 reps                                                                                                            PATIENT EDUCATION:  Education details: updates to HEP Person educated: Patient Education method: Customer service manager Education comprehension: verbalized understanding and returned demonstration  HOME EXERCISE PROGRAM: Access Code: [email or web address] URL: [email or web address] Date: 11/26/2022 Prepared by: [facility]  Exercises - Seated Hamstring Stretch  - 1 x daily - 7 x weekly - 1 sets - 3 reps - 30sec hold - Sit to Stand  - 1 x daily - 7 x weekly - 2 sets - 5 reps - Seated Hip Adduction Squeeze with Ball  - 1 x daily - 7 x weekly - 2 sets - 10 reps - Clamshell  - 1 x daily - 7 x weekly - 2 sets - 10 reps - Standing Hip Extension with Counter Support  - 1 x daily - 7 x weekly - 3 sets - 5 reps - Standing March with Counter Support  - 1 x daily - 7 x weekly - 2 sets - 10 reps - Heel Raises with Counter Support  - 1 x daily - 7 x weekly - 2 sets - 10 reps - Side Stepping with Resistance at Ankles and Counter Support  - 1 x daily - 7 x weekly - 2 sets - 10 reps   ASSESSMENT:  CLINICAL IMPRESSION:  Pt is still a little hesitant to try dry needling. Pt has  increase in Rt hip/LE pain with ADLs. There are multiple trigger points and muscle spasm noted throughut the the glutes, quads and hamstrings. Session mostly focused on manual treatment to decrease muscle spasm in the Rt LE. PT educated pt on pillow between the knees when lying on her side.  Pt felt that this session greatly reduced tension/discomfort in the Rt LE.   OBJECTIVE IMPAIRMENTS: decreased activity tolerance, difficulty walking, decreased balance, decreased endurance, decreased mobility, decreased ROM, decreased strength, impaired flexibility, impaired UE/LE use, postural dysfunction, and pain.  ACTIVITY LIMITATIONS: bending, lifting, carry, locomotion, cleaning, community activity, driving, and or occupation  PERSONAL FACTORS: DM II, OA are also affecting patient's functional outcome.  REHAB POTENTIAL: Good  CLINICAL DECISION MAKING: Stable/uncomplicated  EVALUATION COMPLEXITY: Low    GOALS: Short term PT Goals Target date: 11/18/2022 Pt will be I and compliant with HEP. Baseline:  Goal status: MET on 11/24/2022  Pt will decrease pain by 25% overall Baseline: Goal status: New  Long term PT goals Target date: 01/06/2023 Pt will improve hip/knee strength to at least 5-/5 MMT to improve functional strength Baseline: Goal status: New  Pt will reduce pain by overall 50% overall with usual activity Baseline: Goal status: New  Pt will reduce pain to overall less than 2-3/10 with usual activity and work activity. Baseline: Goal status: New  Pt will be able to ambulate community distances at least 1000 ft WNL gait pattern without complaints Baseline: Goal status: New     5. Pt will improve her STS by 2.3 seconds for Minimal clinically important difference.   Baseline:  Goal status: New   PLAN: PT FREQUENCY: 1-2 times per week   PT DURATION: 8 weeks  PLANNED INTERVENTIONS (unless contraindicated): aquatic PT, Canalith repositioning, cryotherapy, Electrical  stimulation, Iontophoresis with 4 mg/ml dexamethasome, Moist heat, traction, Ultrasound, gait training, Therapeutic exercise, balance training, neuromuscular re-education, patient/family education, prosthetic training, manual techniques, passive ROM, dry needling, taping, vasopnuematic device, vestibular, spinal manipulations, joint manipulations  PLAN FOR NEXT SESSION:  possible DN to glutes, down Rt LE; progress HEP as indicated, strengthening, balance, knee ROM, manual therapy as indicated   11:00 AM,12/01/22 Sherol Dade PT, [place] at [place]

## 2022-12-01 ENCOUNTER — Ambulatory Visit: Payer: PPO | Admitting: Physical Therapy

## 2022-12-01 ENCOUNTER — Encounter: Payer: Self-pay | Admitting: Physical Therapy

## 2022-12-01 DIAGNOSIS — R262 Difficulty in walking, not elsewhere classified: Secondary | ICD-10-CM

## 2022-12-01 DIAGNOSIS — R600 Localized edema: Secondary | ICD-10-CM | POA: Diagnosis not present

## 2022-12-01 DIAGNOSIS — G8929 Other chronic pain: Secondary | ICD-10-CM

## 2022-12-01 DIAGNOSIS — M6281 Muscle weakness (generalized): Secondary | ICD-10-CM

## 2022-12-08 ENCOUNTER — Ambulatory Visit: Payer: PPO | Admitting: Physical Therapy

## 2022-12-08 ENCOUNTER — Encounter: Payer: Self-pay | Admitting: Physical Therapy

## 2022-12-08 DIAGNOSIS — R600 Localized edema: Secondary | ICD-10-CM

## 2022-12-08 DIAGNOSIS — R262 Difficulty in walking, not elsewhere classified: Secondary | ICD-10-CM

## 2022-12-08 DIAGNOSIS — M6281 Muscle weakness (generalized): Secondary | ICD-10-CM

## 2022-12-08 DIAGNOSIS — G8929 Other chronic pain: Secondary | ICD-10-CM

## 2022-12-08 NOTE — Therapy (Signed)
OUTPATIENT PHYSICAL THERAPY TREATMENT NOTE   Patient Name: Hailey Johnson MRN: [id number] DOB:04-03-1946, 77 y.o., female Today's Date: 12/08/2022  PCP: Angelina Pih, MD   END OF SESSION:   PT End of Session - 12/08/22 1021     Visit Number 8    Date for PT Re-Evaluation 01/06/23    Authorization Type [employer]    Progress Note Due on Visit 10    PT Start Time 1016    PT Stop Time 1100    PT Time Calculation (min) 44 min    Activity Tolerance Patient tolerated treatment well;No increased pain    Behavior During Therapy [facility] for tasks assessed/performed                    History reviewed. No pertinent past medical history. History reviewed. No pertinent surgical history. There are no problems to display for this patient.  REFERRING PROVIDER: Angelina Pih, MD  REFERRING DIAG: Unspecified fall, initial encounter [W19.XXXA]   THERAPY DIAG:  Localized edema  Difficulty in walking, not elsewhere classified  Chronic pain of left knee  Chronic pain of right knee  Muscle weakness (generalized)  Rationale for Evaluation and Treatment: Rehabilitation  ONSET DATE: 1.5 years ago   SUBJECTIVE:   SUBJECTIVE STATEMENT: Pt states that her pain is less severe, but standing is still tough.   PERTINENT HISTORY: DM II, OA PAIN:  Are you having pain? Yes: NPRS scale: 2-3/10 Pain location: Bilat Knee Pain description: Sharp, achy Aggravating factors: Walking, standing, pushing, pulling.  Relieving factors: Rest.   PRECAUTIONS: None  WEIGHT BEARING RESTRICTIONS: No  FALLS:  Has patient fallen in last 6 months? No recent falls.   LIVING ENVIRONMENT: Lives with: lives with their spouse Lives in: House/apartment Stairs: Yes: Internal: 12 steps; on right going up and External: 7 steps; on left going up Has following equipment at home: Crutches  OCCUPATION: Retired.   PLOF: Independent  PATIENT GOALS: Pt would like to get back to  being independent with functional movements.    OBJECTIVE:   DIAGNOSTIC FINDINGS: None recent.   COGNITION: Overall cognitive status: Within functional limits for tasks assessed     SENSATION: WFL  POSTURE: No Significant postural limitations  PALPATION: Pt with tenderness to R knee due to bruising from incident yesterday  LOWER EXTREMITY ROM:  Active ROM Right eval Left eval  Hip flexion [facility]  Knee flexion Limited due to pain WFL  Knee extension Limited due to pain WFL   (Blank rows = not tested)  LOWER EXTREMITY MMT:  MMT Right eval Left eval  Hip flexion 4+ 4+  Knee flexion NT due to pain 4+  Knee extension NT due to pain 4-   (Blank rows = not tested)  FUNCTIONAL TESTS:  Eval: 5 times sit to stand: 39.60 sec with significant pain in her R knee. Ue of bilat UE's.  5x sit to stand: 14 sec, no UE  GAIT: Distance walked: 55f  Assistive device utilized: none Level of assistance: Complete Independence Comments: Pt states that she uses 1 crutch when using stairs   TODAY'S TREATMENT:       DATE:  12/08/22 leg press seat 8 single leg 3x5 reps #40 each side Attempted PF at leg press, but pt unable Seated PF green TB 2x10 reps each LE Seated LAQ #3 2x10 reps each  Seated hamstring curls green band x20 reps each  Supine bridge with feet on airex pad x10 reps  Manual:  addaday to Rt glutes/lateral quad/lateral hamstring/peroneals and gastroc  12/01/22 Reviewed HEP and verbal instructions/questions answered Supine piriformis stretch 2x20 sec figure 4 and knee to opposite shoulder Manual treatment: STM Rt gluteals, Rt TFL, Rt lateral hamstring/quad, Rt peroneals; addaday to the previously listed areas  11/26/22 Nustep L4 x5 min PT present to discuss issues with HEP Sidestepping yellow TB around ankles 2x10 reps with seated rest between sets Standing hip extension with lean onto forearms 2x5 reps  5x sit to stand 14 sec Discussion of HEP and updates made this  visit Gastroc stretch against wall, Rt only-pt did not feel a good stretch with this Manual: STM Rt gastroc/peroneals   11/24/2022 Nustep level 4 x6 min with PT present to discuss status Sit to/from stand x10 Seated hamstring stretch 2x20 sec bilat Left Sidelying clamshell 2x10  Supine bridge 2x10 Supine SAQ with pink ball 2x10 bilat Supine hip adduction with pink ball 2x10 Supine hamstring stretch with strap 2x20 sec RLE Manual Therapy:  side-lying on left side utilized Addaday to right glutes/piriformis, IT band, hamstring, calf Standing at counter:  heel raises, marching, hip abduction, hip extension.  X10 bilat   DATE: 11/18/22 Nustep L 3 x5 min Supine bridge x10 reps Supine adductor squeezes, 5 sec hold, x20 SAQ with pink ball x15 Sidelying Rt clam red TB 2x10 Supine marching with red TB 2x10  Seated hamstring curl Rt, red TB 2x10 reps Standing heel raises x20 reps                                                                                                            PATIENT EDUCATION:  Education details: updates to HEP Person educated: Patient Education method: Customer service manager Education comprehension: verbalized understanding and returned demonstration  HOME EXERCISE PROGRAM: Access Code: [email or web address] URL: [email or web address] Date: 11/26/2022 Prepared by: [facility]  Exercises - Seated Hamstring Stretch  - 1 x daily - 7 x weekly - 1 sets - 3 reps - 30sec hold - Sit to Stand  - 1 x daily - 7 x weekly - 2 sets - 5 reps - Seated Hip Adduction Squeeze with Ball  - 1 x daily - 7 x weekly - 2 sets - 10 reps - Clamshell  - 1 x daily - 7 x weekly - 2 sets - 10 reps - Standing Hip Extension with Counter Support  - 1 x daily - 7 x weekly - 3 sets - 5 reps - Standing March with Counter Support  - 1 x daily - 7 x weekly - 2 sets - 10 reps - Heel Raises with Counter Support  - 1 x daily - 7 x weekly -  2 sets - 10 reps - Side Stepping with Resistance at Ankles and Counter Support  - 1 x daily - 7 x weekly - 2 sets - 10 reps   ASSESSMENT:  CLINICAL IMPRESSION:  Pt's pain is lower today. Overall, she notes pain improvement but her walking and standing is still limited. Session focused  on therex to promote LE strength bilaterally and end with soft tissue mobilization to the Rt LE. Pt feels this is helpful at decreasing tension in the LE. Pt would benefit from continued progression of LE strength and proprioception moving forward. She will be in touch regarding scheduling more appointments in the near future.    OBJECTIVE IMPAIRMENTS: decreased activity tolerance, difficulty walking, decreased balance, decreased endurance, decreased mobility, decreased ROM, decreased strength, impaired flexibility, impaired UE/LE use, postural dysfunction, and pain.  ACTIVITY LIMITATIONS: bending, lifting, carry, locomotion, cleaning, community activity, driving, and or occupation  PERSONAL FACTORS: DM II, OA are also affecting patient's functional outcome.  REHAB POTENTIAL: Good  CLINICAL DECISION MAKING: Stable/uncomplicated  EVALUATION COMPLEXITY: Low    GOALS: Short term PT Goals Target date: 11/18/2022 Pt will be I and compliant with HEP. Baseline:  Goal status: MET on 11/24/2022  Pt will decrease pain by 25% overall Baseline: Goal status: New  Long term PT goals Target date: 01/06/2023 Pt will improve hip/knee strength to at least 5-/5 MMT to improve functional strength Baseline: Goal status: New  Pt will reduce pain by overall 50% overall with usual activity Baseline: Goal status: New  Pt will reduce pain to overall less than 2-3/10 with usual activity and work activity. Baseline: Goal status: New  Pt will be able to ambulate community distances at least 1000 ft WNL gait pattern without complaints Baseline: Goal status: New     5. Pt will improve her STS by 2.3 seconds for  Minimal clinically important difference.   Baseline:  Goal status: New   PLAN: PT FREQUENCY: 1-2 times per week   PT DURATION: 8 weeks  PLANNED INTERVENTIONS (unless contraindicated): aquatic PT, Canalith repositioning, cryotherapy, Electrical stimulation, Iontophoresis with 4 mg/ml dexamethasome, Moist heat, traction, Ultrasound, gait training, Therapeutic exercise, balance training, neuromuscular re-education, patient/family education, prosthetic training, manual techniques, passive ROM, dry needling, taping, vasopnuematic device, vestibular, spinal manipulations, joint manipulations  PLAN FOR NEXT SESSION:  possible DN to glutes, down Rt LE; progress HEP as indicated, strengthening, balance, knee ROM, manual therapy as indicated- pt is going to f/u with referring MD in a week and will get back to [place] about scheduling more appointments.   12:25 PM,12/08/22 Sherol Dade PT, [employer] at [place]

## 2022-12-13 DIAGNOSIS — I10 Essential (primary) hypertension: Secondary | ICD-10-CM | POA: Diagnosis not present

## 2022-12-13 DIAGNOSIS — E785 Hyperlipidemia, unspecified: Secondary | ICD-10-CM | POA: Diagnosis not present

## 2022-12-13 DIAGNOSIS — Z1159 Encounter for screening for other viral diseases: Secondary | ICD-10-CM | POA: Diagnosis not present

## 2022-12-13 DIAGNOSIS — E1169 Type 2 diabetes mellitus with other specified complication: Secondary | ICD-10-CM | POA: Diagnosis not present

## 2022-12-15 DIAGNOSIS — J301 Allergic rhinitis due to pollen: Secondary | ICD-10-CM | POA: Diagnosis not present

## 2022-12-15 DIAGNOSIS — M170 Bilateral primary osteoarthritis of knee: Secondary | ICD-10-CM | POA: Diagnosis not present

## 2022-12-15 DIAGNOSIS — E1165 Type 2 diabetes mellitus with hyperglycemia: Secondary | ICD-10-CM | POA: Diagnosis not present

## 2022-12-15 DIAGNOSIS — Z23 Encounter for immunization: Secondary | ICD-10-CM | POA: Diagnosis not present

## 2022-12-15 DIAGNOSIS — Z6841 Body Mass Index (BMI) 40.0 and over, adult: Secondary | ICD-10-CM | POA: Diagnosis not present

## 2022-12-15 DIAGNOSIS — Z1211 Encounter for screening for malignant neoplasm of colon: Secondary | ICD-10-CM | POA: Diagnosis not present

## 2022-12-15 DIAGNOSIS — Z0000 Encounter for general adult medical examination without abnormal findings: Secondary | ICD-10-CM | POA: Diagnosis not present

## 2022-12-15 DIAGNOSIS — I10 Essential (primary) hypertension: Secondary | ICD-10-CM | POA: Diagnosis not present

## 2022-12-15 DIAGNOSIS — E785 Hyperlipidemia, unspecified: Secondary | ICD-10-CM | POA: Diagnosis not present

## 2022-12-15 DIAGNOSIS — R0781 Pleurodynia: Secondary | ICD-10-CM | POA: Diagnosis not present

## 2022-12-15 DIAGNOSIS — E1136 Type 2 diabetes mellitus with diabetic cataract: Secondary | ICD-10-CM | POA: Diagnosis not present

## 2022-12-17 DIAGNOSIS — Z1211 Encounter for screening for malignant neoplasm of colon: Secondary | ICD-10-CM | POA: Diagnosis not present

## 2023-01-12 DIAGNOSIS — Z780 Asymptomatic menopausal state: Secondary | ICD-10-CM | POA: Diagnosis not present

## 2023-03-03 DIAGNOSIS — E1136 Type 2 diabetes mellitus with diabetic cataract: Secondary | ICD-10-CM | POA: Diagnosis not present

## 2023-03-03 DIAGNOSIS — Z7984 Long term (current) use of oral hypoglycemic drugs: Secondary | ICD-10-CM | POA: Diagnosis not present

## 2023-03-03 DIAGNOSIS — H25813 Combined forms of age-related cataract, bilateral: Secondary | ICD-10-CM | POA: Diagnosis not present

## 2023-03-03 DIAGNOSIS — H524 Presbyopia: Secondary | ICD-10-CM | POA: Diagnosis not present

## 2023-03-03 DIAGNOSIS — H52203 Unspecified astigmatism, bilateral: Secondary | ICD-10-CM | POA: Diagnosis not present

## 2023-03-03 DIAGNOSIS — H5212 Myopia, left eye: Secondary | ICD-10-CM | POA: Diagnosis not present

## 2023-03-03 NOTE — Progress Notes (Signed)
 [facility] HEALTH NETWORK [facility] EYE CARE - [place] [address] [place] [address] [phone] Clinical Visit Note      CHIEF COMPLAINT Patient presents for Diabetic Eye Exam   HISTORY OF PRESENT ILLNESS: Hailey Johnson is a 77 y.o. old female who presents to the clinic today for:   HPI     Diabetic Eye Exam   I, the attending physician, performed the HPI with the patient and updated the documentation appropriately.        Comments   Type II DM on Janumet. BS fluctuates due to stress per pt. Last A1C unknown. Pt states left eye seems to get more tired and blurry than it used to. Especially long distance.  +Cataracts      Last edited by Oneil Debby Platts, MD on 03/03/2023 10:29 AM.      HISTORICAL INFORMATION:    CURRENT MEDICATIONS:  Current Outpatient Medications:  .  glipiZIDE (GLUCOTROL XL) 5 mg 24 hr tablet, TAKE 1 TABLET BY MOUTH EVERY DAY, Disp: , Rfl: 11 .  Janumet XR 50-1,000 mg TM24, TAKE 2 TABLETS BY MOUTH DAILY WITH EVENING MEAL, Disp: , Rfl:    Referring physician: No referring provider defined for this encounter.   ALLERGIES Allergies  Allergen Reactions  . Antihistamine-1 Palpitations  . Latex Rash    PAST MEDICAL HISTORY Past Medical History:  Diagnosis Date  . Cataract   . Diabetes mellitus (CMS/HCC)    Type 2 DM takes pills   History reviewed. No pertinent surgical history.  FAMILY HISTORY Family History  Problem Relation Name Age of Onset  . Cataracts Mother    . Hypertension Mother    . Cataracts Father    . Diabetes Father    . Hypertension Father    . Heart attack Father    . Cancer Sister    . Hypertension Sister    . Glaucoma Neg Hx    . Macular degeneration Neg Hx    . Retinal detachment Neg Hx    . Strabismus Neg Hx    . Stroke Neg Hx      SOCIAL HISTORY Social History   Tobacco Use  . Smoking status: Never  . Smokeless tobacco: Never  Substance Use Topics  . Alcohol  use: Not  Currently        OPHTHALMIC EXAM:  Base Eye Exam     Visual Acuity (Snellen - Linear)       Right Left   Dist [place] 20/25 20/60 +2         Tonometry (Applanation: Fluress OU, 10:15 AM)       Right Left   Pressure 16 14         Pupils       Pupils   Right PERRL   Left PERRL         Visual Fields       Left Right    Full Full         Extraocular Movement       Right Left    Full, Ortho Full, Ortho         Neuro/Psych     Oriented x3: Yes   Mood/Affect: Normal         Dilation     Both eyes: 2.5% Phenylephrine, 1.0% Tropicamide @ 10:16 AM  Angles Open by Fleeta Dykes.           Slit Lamp and Fundus Exam     External  Exam       Right Left   External Normal Normal         Slit Lamp Exam       Right Left   Lids/Lashes Normal Normal   Conjunctiva/Sclera White and quiet White and quiet   Cornea Clear Clear   Anterior Chamber Deep and quiet Deep and quiet   Iris Round and reactive Round and reactive   Lens 2+ Nuclear sclerosis, 2+ Cortical cataract 2+ Nuclear sclerosis, 2+ Cortical cataract         Fundus Exam       Right Left   Vitreous Normal Normal   Disc Normal Normal   C/D Ratio 0.5 0.5   Macula Normal Normal   Vessels Normal, no Retinopathy Normal, no Retinopathy   Periphery Normal Normal           Refraction     Wearing Rx       Sphere   Right +2.50   Left +2.50    Type: OTC  Sometimes wears +3.00 OTC's        Manifest Refraction       Sphere Cylinder Axis Dist VA Add Near [place]   Right +0.50 +0.50 020 20/20 +2.50 J1+   Left -1.75 +1.00 155 20/50+2 +2.50          Final Rx       Sphere Cylinder Axis Dist VA Add Near [place]   Right +0.50 +0.50 020 20/20 +2.50 J1+   Left -1.75 +1.00 155 20/50+2 +2.50     Expiration Date: 03/02/2024            IMAGING AND PROCEDURES:       ASSESSMENT/PLAN:  1. Diabetes mellitus without complication (CMS/HCC)      2. Nuclear sclerotic cataract of both  eyes      3. Cortical age-related cataract of both eyes      4. Myopia of left eye      5. Astigmatism of both eyes with presbyopia        Ophthalmic Meds Ordered this visit:  No orders of the defined types were placed in this encounter.     Return for 1 Year Diabetic CE.  There are no Patient Instructions on file for this visit.  Type II DM  No diabetic retinopathy OU, continue to follow annually. We will send a note to the patients doctor.  Cataracts both eyes, progressed. Not visually significant to the patient at this time, continue annual eye exams.  Copy of glasses Rx given to patient.  Explained the diagnoses, plan, and follow up with the patient and they expressed understanding.  Patient expressed understanding of the importance of proper follow up care.    Abbreviations: M myopia (nearsighted); A astigmatism; H hyperopia (farsighted); P presbyopia; Mrx spectacle prescription;  CTL contact lenses; OD right eye; OS left eye; OU both eyes  XT exotropia; ET esotropia; PEK punctate epithelial keratitis; PEE punctate epithelial erosions; DES dry eye syndrome; MGD meibomian gland dysfunction; ATs artificial tears; PFAT's preservative free artificial tears; NSC nuclear sclerotic cataract; PSC posterior subcapsular cataract; ERM epi-retinal membrane; PVD posterior vitreous detachment; RD retinal detachment; DM diabetes mellitus; DR diabetic retinopathy; NPDR non-proliferative diabetic retinopathy; PDR proliferative diabetic retinopathy; CSME clinically significant macular edema; DME diabetic macular edema; dbh dot blot hemorrhages; CWS cotton wool spot; POAG primary open angle glaucoma; C/D cup-to-disc ratio; HVF humphrey visual field; GVF goldmann visual field; OCT optical coherence tomography; IOP intraocular pressure; BRVO Branch retinal vein  occlusion; CRVO central retinal vein occlusion; CRAO central retinal artery occlusion; BRAO branch retinal artery occlusion; RT retinal tear;  SB scleral buckle; PPV pars plana vitrectomy; VH Vitreous hemorrhage; PRP panretinal laser photocoagulation; IVK intravitreal kenalog ; VMT vitreomacular traction; MH Macular hole;  NVD neovascularization of the disc; NVE neovascularization elsewhere; AREDS age related eye disease study; ARMD age related macular degeneration; POAG primary open angle glaucoma; EBMD epithelial/anterior basement membrane dystrophy; ACIOL anterior chamber intraocular lens; IOL intraocular lens; PCIOL posterior chamber intraocular lens; Phaco/IOL phacoemulsification with intraocular lens placement; PRK photorefractive keratectomy; LASIK laser assisted in situ keratomileusis; HTN hypertension; DM diabetes mellitus; COPD chronic obstructive pulmonary disease   This document serves as a record of services personally performed by Oneil ONEIDA Platts, MD.  It was created on their behalf by Wellington Earnie Bloch, COA, a trained medical scribe, and Certified Ophthalmic Assistant (COA). During the course of documenting the history, physical exam and medical decision making, I was functioning as a Stage manager. The creation of this record is the provider's dictation and/or activities during the visit.  Electronically signed by Wellington Earnie Bloch, COA 03/03/2023 10:31 AM I agree the documentation is accurate and complete.  Electronically signed by: Oneil Platts, MD 03/03/2023 10:33 AM

## 2023-03-09 DIAGNOSIS — I10 Essential (primary) hypertension: Secondary | ICD-10-CM | POA: Diagnosis not present

## 2023-03-09 DIAGNOSIS — Z6839 Body mass index (BMI) 39.0-39.9, adult: Secondary | ICD-10-CM | POA: Diagnosis not present

## 2023-03-09 DIAGNOSIS — E119 Type 2 diabetes mellitus without complications: Secondary | ICD-10-CM | POA: Diagnosis not present

## 2023-03-09 DIAGNOSIS — E785 Hyperlipidemia, unspecified: Secondary | ICD-10-CM | POA: Diagnosis not present

## 2023-03-21 DIAGNOSIS — E1169 Type 2 diabetes mellitus with other specified complication: Secondary | ICD-10-CM | POA: Diagnosis not present

## 2023-04-13 DIAGNOSIS — Z1231 Encounter for screening mammogram for malignant neoplasm of breast: Secondary | ICD-10-CM | POA: Diagnosis not present

## 2023-06-20 DIAGNOSIS — E1169 Type 2 diabetes mellitus with other specified complication: Secondary | ICD-10-CM | POA: Diagnosis not present

## 2023-06-20 DIAGNOSIS — E785 Hyperlipidemia, unspecified: Secondary | ICD-10-CM | POA: Diagnosis not present

## 2023-06-22 DIAGNOSIS — E119 Type 2 diabetes mellitus without complications: Secondary | ICD-10-CM | POA: Diagnosis not present

## 2023-06-22 DIAGNOSIS — I10 Essential (primary) hypertension: Secondary | ICD-10-CM | POA: Diagnosis not present

## 2023-06-22 DIAGNOSIS — E785 Hyperlipidemia, unspecified: Secondary | ICD-10-CM | POA: Diagnosis not present

## 2023-06-22 DIAGNOSIS — Z6841 Body Mass Index (BMI) 40.0 and over, adult: Secondary | ICD-10-CM | POA: Diagnosis not present

## 2023-12-19 DIAGNOSIS — I10 Essential (primary) hypertension: Secondary | ICD-10-CM | POA: Diagnosis not present

## 2023-12-19 DIAGNOSIS — E1169 Type 2 diabetes mellitus with other specified complication: Secondary | ICD-10-CM | POA: Diagnosis not present

## 2023-12-19 DIAGNOSIS — D649 Anemia, unspecified: Secondary | ICD-10-CM | POA: Diagnosis not present

## 2023-12-19 DIAGNOSIS — E785 Hyperlipidemia, unspecified: Secondary | ICD-10-CM | POA: Diagnosis not present

## 2023-12-21 DIAGNOSIS — E1169 Type 2 diabetes mellitus with other specified complication: Secondary | ICD-10-CM | POA: Diagnosis not present

## 2023-12-21 DIAGNOSIS — Z6839 Body mass index (BMI) 39.0-39.9, adult: Secondary | ICD-10-CM | POA: Diagnosis not present

## 2023-12-21 DIAGNOSIS — I10 Essential (primary) hypertension: Secondary | ICD-10-CM | POA: Diagnosis not present

## 2023-12-21 DIAGNOSIS — E785 Hyperlipidemia, unspecified: Secondary | ICD-10-CM | POA: Diagnosis not present

## 2023-12-21 DIAGNOSIS — E66812 Obesity, class 2: Secondary | ICD-10-CM | POA: Diagnosis not present

## 2023-12-21 DIAGNOSIS — Z1331 Encounter for screening for depression: Secondary | ICD-10-CM | POA: Diagnosis not present

## 2023-12-21 DIAGNOSIS — Z0000 Encounter for general adult medical examination without abnormal findings: Secondary | ICD-10-CM | POA: Diagnosis not present

## 2024-03-08 DIAGNOSIS — Z7984 Long term (current) use of oral hypoglycemic drugs: Secondary | ICD-10-CM | POA: Diagnosis not present

## 2024-03-08 DIAGNOSIS — H2513 Age-related nuclear cataract, bilateral: Secondary | ICD-10-CM | POA: Diagnosis not present

## 2024-03-08 DIAGNOSIS — H25013 Cortical age-related cataract, bilateral: Secondary | ICD-10-CM | POA: Diagnosis not present

## 2024-03-08 DIAGNOSIS — E119 Type 2 diabetes mellitus without complications: Secondary | ICD-10-CM | POA: Diagnosis not present

## 2024-03-26 DIAGNOSIS — E785 Hyperlipidemia, unspecified: Secondary | ICD-10-CM | POA: Diagnosis not present

## 2024-03-26 DIAGNOSIS — E1165 Type 2 diabetes mellitus with hyperglycemia: Secondary | ICD-10-CM | POA: Diagnosis not present

## 2024-03-26 DIAGNOSIS — M170 Bilateral primary osteoarthritis of knee: Secondary | ICD-10-CM | POA: Diagnosis not present

## 2024-03-26 DIAGNOSIS — E1169 Type 2 diabetes mellitus with other specified complication: Secondary | ICD-10-CM | POA: Diagnosis not present

## 2024-03-26 DIAGNOSIS — E1136 Type 2 diabetes mellitus with diabetic cataract: Secondary | ICD-10-CM | POA: Diagnosis not present

## 2024-03-26 DIAGNOSIS — E66812 Obesity, class 2: Secondary | ICD-10-CM | POA: Diagnosis not present

## 2024-03-26 DIAGNOSIS — I10 Essential (primary) hypertension: Secondary | ICD-10-CM | POA: Diagnosis not present

## 2024-04-09 DIAGNOSIS — E1169 Type 2 diabetes mellitus with other specified complication: Secondary | ICD-10-CM | POA: Diagnosis not present

## 2024-04-12 DIAGNOSIS — E66813 Obesity, class 3: Secondary | ICD-10-CM | POA: Diagnosis not present

## 2024-04-12 DIAGNOSIS — I10 Essential (primary) hypertension: Secondary | ICD-10-CM | POA: Diagnosis not present

## 2024-04-12 DIAGNOSIS — E1169 Type 2 diabetes mellitus with other specified complication: Secondary | ICD-10-CM | POA: Diagnosis not present

## 2024-04-12 DIAGNOSIS — Z6841 Body Mass Index (BMI) 40.0 and over, adult: Secondary | ICD-10-CM | POA: Diagnosis not present

## 2024-04-25 DIAGNOSIS — Z1231 Encounter for screening mammogram for malignant neoplasm of breast: Secondary | ICD-10-CM | POA: Diagnosis not present

## 2024-04-26 DIAGNOSIS — E785 Hyperlipidemia, unspecified: Secondary | ICD-10-CM | POA: Diagnosis not present

## 2024-04-26 DIAGNOSIS — M170 Bilateral primary osteoarthritis of knee: Secondary | ICD-10-CM | POA: Diagnosis not present

## 2024-04-26 DIAGNOSIS — E66812 Obesity, class 2: Secondary | ICD-10-CM | POA: Diagnosis not present

## 2024-05-27 DIAGNOSIS — E785 Hyperlipidemia, unspecified: Secondary | ICD-10-CM | POA: Diagnosis not present

## 2024-05-27 DIAGNOSIS — E1169 Type 2 diabetes mellitus with other specified complication: Secondary | ICD-10-CM | POA: Diagnosis not present

## 2024-05-27 DIAGNOSIS — E66812 Obesity, class 2: Secondary | ICD-10-CM | POA: Diagnosis not present

## 2024-05-27 DIAGNOSIS — E1165 Type 2 diabetes mellitus with hyperglycemia: Secondary | ICD-10-CM | POA: Diagnosis not present

## 2024-05-27 DIAGNOSIS — M170 Bilateral primary osteoarthritis of knee: Secondary | ICD-10-CM | POA: Diagnosis not present

## 2024-05-27 DIAGNOSIS — E1136 Type 2 diabetes mellitus with diabetic cataract: Secondary | ICD-10-CM | POA: Diagnosis not present

## 2024-05-27 DIAGNOSIS — I10 Essential (primary) hypertension: Secondary | ICD-10-CM | POA: Diagnosis not present

## 2024-06-07 DIAGNOSIS — H25043 Posterior subcapsular polar age-related cataract, bilateral: Secondary | ICD-10-CM | POA: Diagnosis not present

## 2024-06-07 DIAGNOSIS — H2513 Age-related nuclear cataract, bilateral: Secondary | ICD-10-CM | POA: Diagnosis not present

## 2024-06-07 DIAGNOSIS — H2512 Age-related nuclear cataract, left eye: Secondary | ICD-10-CM | POA: Diagnosis not present

## 2024-06-07 DIAGNOSIS — H18413 Arcus senilis, bilateral: Secondary | ICD-10-CM | POA: Diagnosis not present

## 2024-06-07 DIAGNOSIS — H25013 Cortical age-related cataract, bilateral: Secondary | ICD-10-CM | POA: Diagnosis not present

## 2024-06-08 ENCOUNTER — Encounter (HOSPITAL_COMMUNITY): Payer: Self-pay | Admitting: Internal Medicine

## 2024-06-08 ENCOUNTER — Other Ambulatory Visit: Payer: Self-pay

## 2024-06-08 ENCOUNTER — Emergency Department (HOSPITAL_BASED_OUTPATIENT_CLINIC_OR_DEPARTMENT_OTHER)

## 2024-06-08 ENCOUNTER — Emergency Department (HOSPITAL_BASED_OUTPATIENT_CLINIC_OR_DEPARTMENT_OTHER): Admitting: Radiology

## 2024-06-08 ENCOUNTER — Inpatient Hospital Stay (HOSPITAL_BASED_OUTPATIENT_CLINIC_OR_DEPARTMENT_OTHER)
Admission: EM | Admit: 2024-06-08 | Discharge: 2024-06-11 | DRG: 066 | Disposition: A | Discharge status: Home Health | Attending: Internal Medicine | Admitting: Internal Medicine

## 2024-06-08 DIAGNOSIS — Z7984 Long term (current) use of oral hypoglycemic drugs: Secondary | ICD-10-CM | POA: Diagnosis not present

## 2024-06-08 DIAGNOSIS — I639 Cerebral infarction, unspecified: Secondary | ICD-10-CM | POA: Diagnosis not present

## 2024-06-08 DIAGNOSIS — R29818 Other symptoms and signs involving the nervous system: Secondary | ICD-10-CM | POA: Diagnosis not present

## 2024-06-08 DIAGNOSIS — I6381 Other cerebral infarction due to occlusion or stenosis of small artery: Secondary | ICD-10-CM | POA: Diagnosis not present

## 2024-06-08 DIAGNOSIS — Z7982 Long term (current) use of aspirin: Secondary | ICD-10-CM

## 2024-06-08 DIAGNOSIS — E669 Obesity, unspecified: Secondary | ICD-10-CM | POA: Diagnosis not present

## 2024-06-08 DIAGNOSIS — Z7902 Long term (current) use of antithrombotics/antiplatelets: Secondary | ICD-10-CM | POA: Diagnosis not present

## 2024-06-08 DIAGNOSIS — I6523 Occlusion and stenosis of bilateral carotid arteries: Secondary | ICD-10-CM | POA: Diagnosis not present

## 2024-06-08 DIAGNOSIS — R471 Dysarthria and anarthria: Secondary | ICD-10-CM | POA: Diagnosis present

## 2024-06-08 DIAGNOSIS — Z91040 Latex allergy status: Secondary | ICD-10-CM

## 2024-06-08 DIAGNOSIS — I63232 Cerebral infarction due to unspecified occlusion or stenosis of left carotid arteries: Secondary | ICD-10-CM | POA: Diagnosis not present

## 2024-06-08 DIAGNOSIS — R29703 NIHSS score 3: Secondary | ICD-10-CM | POA: Diagnosis not present

## 2024-06-08 DIAGNOSIS — R4781 Slurred speech: Secondary | ICD-10-CM | POA: Diagnosis not present

## 2024-06-08 DIAGNOSIS — I6389 Other cerebral infarction: Secondary | ICD-10-CM | POA: Diagnosis not present

## 2024-06-08 DIAGNOSIS — I672 Cerebral atherosclerosis: Secondary | ICD-10-CM | POA: Diagnosis not present

## 2024-06-08 DIAGNOSIS — I6611 Occlusion and stenosis of right anterior cerebral artery: Secondary | ICD-10-CM | POA: Diagnosis not present

## 2024-06-08 DIAGNOSIS — J069 Acute upper respiratory infection, unspecified: Secondary | ICD-10-CM | POA: Diagnosis present

## 2024-06-08 DIAGNOSIS — Z1152 Encounter for screening for COVID-19: Secondary | ICD-10-CM

## 2024-06-08 DIAGNOSIS — I700 Atherosclerosis of aorta: Secondary | ICD-10-CM | POA: Diagnosis not present

## 2024-06-08 DIAGNOSIS — E1165 Type 2 diabetes mellitus with hyperglycemia: Secondary | ICD-10-CM | POA: Diagnosis present

## 2024-06-08 DIAGNOSIS — E1151 Type 2 diabetes mellitus with diabetic peripheral angiopathy without gangrene: Secondary | ICD-10-CM | POA: Diagnosis not present

## 2024-06-08 DIAGNOSIS — R4701 Aphasia: Secondary | ICD-10-CM | POA: Diagnosis present

## 2024-06-08 DIAGNOSIS — R29810 Facial weakness: Secondary | ICD-10-CM | POA: Diagnosis present

## 2024-06-08 DIAGNOSIS — R059 Cough, unspecified: Secondary | ICD-10-CM | POA: Diagnosis not present

## 2024-06-08 DIAGNOSIS — Z888 Allergy status to other drugs, medicaments and biological substances status: Secondary | ICD-10-CM | POA: Diagnosis not present

## 2024-06-08 DIAGNOSIS — I779 Disorder of arteries and arterioles, unspecified: Secondary | ICD-10-CM | POA: Diagnosis not present

## 2024-06-08 DIAGNOSIS — I771 Stricture of artery: Secondary | ICD-10-CM | POA: Diagnosis not present

## 2024-06-08 DIAGNOSIS — R29702 NIHSS score 2: Secondary | ICD-10-CM | POA: Diagnosis not present

## 2024-06-08 DIAGNOSIS — I7090 Unspecified atherosclerosis: Secondary | ICD-10-CM | POA: Diagnosis not present

## 2024-06-08 DIAGNOSIS — I6623 Occlusion and stenosis of bilateral posterior cerebral arteries: Secondary | ICD-10-CM | POA: Diagnosis not present

## 2024-06-08 DIAGNOSIS — I10 Essential (primary) hypertension: Secondary | ICD-10-CM | POA: Diagnosis present

## 2024-06-08 DIAGNOSIS — Z6837 Body mass index (BMI) 37.0-37.9, adult: Secondary | ICD-10-CM

## 2024-06-08 DIAGNOSIS — I6602 Occlusion and stenosis of left middle cerebral artery: Secondary | ICD-10-CM | POA: Diagnosis not present

## 2024-06-08 DIAGNOSIS — E785 Hyperlipidemia, unspecified: Secondary | ICD-10-CM | POA: Diagnosis present

## 2024-06-08 LAB — DIFFERENTIAL
Abs Immature Granulocytes: 0.07 K/uL (ref 0.00–0.07)
Basophils Absolute: 0.1 K/uL (ref 0.0–0.1)
Basophils Relative: 1 %
Eosinophils Absolute: 0.5 K/uL (ref 0.0–0.5)
Eosinophils Relative: 4 %
Immature Granulocytes: 1 %
Lymphocytes Relative: 24 %
Lymphs Abs: 2.6 K/uL (ref 0.7–4.0)
Monocytes Absolute: 0.7 K/uL (ref 0.1–1.0)
Monocytes Relative: 7 %
Neutro Abs: 7.1 K/uL (ref 1.7–7.7)
Neutrophils Relative %: 63 %

## 2024-06-08 LAB — CBC
HCT: 35.4 % — ABNORMAL LOW (ref 36.0–46.0)
Hemoglobin: 12.2 g/dL (ref 12.0–15.0)
MCH: 30 pg (ref 26.0–34.0)
MCHC: 34.5 g/dL (ref 30.0–36.0)
MCV: 87.2 fL (ref 80.0–100.0)
Platelets: 379 K/uL (ref 150–400)
RBC: 4.06 MIL/uL (ref 3.87–5.11)
RDW: 12.6 % (ref 11.5–15.5)
WBC: 11 K/uL — ABNORMAL HIGH (ref 4.0–10.5)
nRBC: 0 % (ref 0.0–0.2)

## 2024-06-08 LAB — COMPREHENSIVE METABOLIC PANEL WITH GFR
ALT: 13 U/L (ref 0–44)
AST: 16 U/L (ref 15–41)
Albumin: 4.1 g/dL (ref 3.5–5.0)
Alkaline Phosphatase: 115 U/L (ref 38–126)
Anion gap: 17 — ABNORMAL HIGH (ref 5–15)
BUN: 14 mg/dL (ref 8–23)
CO2: 19 mmol/L — ABNORMAL LOW (ref 22–32)
Calcium: 9.9 mg/dL (ref 8.9–10.3)
Chloride: 98 mmol/L (ref 98–111)
Creatinine, Ser: 0.92 mg/dL (ref 0.44–1.00)
GFR, Estimated: >60 mL/min (ref >60)
Glucose, Bld: 232 mg/dL — ABNORMAL HIGH (ref 70–99)
Potassium: 4.4 mmol/L (ref 3.5–5.1)
Sodium: 134 mmol/L — ABNORMAL LOW (ref 135–145)
Total Bilirubin: 0.3 mg/dL (ref 0.0–1.2)
Total Protein: 7.2 g/dL (ref 6.5–8.1)

## 2024-06-08 LAB — RESP PANEL BY RT-PCR (RSV, FLU A&B, COVID)  RVPGX2
Influenza A by PCR: NEGATIVE
Influenza B by PCR: NEGATIVE
Resp Syncytial Virus by PCR: NEGATIVE
SARS Coronavirus 2 by RT PCR: NEGATIVE

## 2024-06-08 LAB — PROTIME-INR
INR: 1 (ref 0.8–1.2)
Prothrombin Time: 13.2 s (ref 11.4–15.2)

## 2024-06-08 LAB — ETHANOL: Alcohol, Ethyl (B): <15 mg/dL (ref <15)

## 2024-06-08 LAB — APTT: aPTT: 25 s (ref 24–36)

## 2024-06-08 LAB — CBG MONITORING, ED: Glucose-Capillary: 212 mg/dL — ABNORMAL HIGH (ref 70–99)

## 2024-06-08 MED ORDER — CLOPIDOGREL BISULFATE 300 MG PO TABS
300.0000 mg | ORAL_TABLET | Freq: Once | ORAL | Status: AC
  Administered 2024-06-08: 300 mg via ORAL
  Filled 2024-06-08: qty 1

## 2024-06-08 MED ORDER — IOHEXOL 350 MG/ML SOLN
75.0000 mL | Freq: Once | INTRAVENOUS | Status: AC | PRN
Start: 2024-06-08 — End: 2024-06-08
  Administered 2024-06-08: 75 mL via INTRAVENOUS

## 2024-06-08 MED ORDER — ASPIRIN 81 MG PO CHEW
324.0000 mg | CHEWABLE_TABLET | Freq: Once | ORAL | Status: AC
  Administered 2024-06-08: 324 mg via ORAL
  Filled 2024-06-08: qty 4

## 2024-06-08 MED ORDER — SODIUM CHLORIDE 0.9% FLUSH: 3.0000 mL | Freq: Once | INTRAVENOUS | Status: DC

## 2024-06-08 NOTE — ED Notes (Signed)
 Notified Dr. Yolande of elevated Bps 203/97 and 192/100. Instructed to report if systolic is >225. He will call neuro for further orders.

## 2024-06-08 NOTE — ED Notes (Signed)
 Dr. Yolande reports that neuro recommends permissive hypertension for 48 hours.

## 2024-06-08 NOTE — ED Provider Notes (Signed)
 [place] EMERGENCY DEPARTMENT AT [facility] Provider Note   CSN: [id number] Arrival date & time: 06/08/24  1435     Patient presents with: Aphasia   Hailey Johnson is a 78 y.o. female.   78 year old female with a history of diabetes, hypertension, and hyperlipidemia presents emergency department difficulty speaking.  Reports that recently she has been getting over a cold.  Says that she went to bed normal on Thursday morning at 1 AM and woke up at 8 AM and was speaking to her husband and noticed that at 8 AM her voice was different.  Says that she went throughout the day and felt like she could not quite get her speech back and decided to come in today since it was still abnormal.  No severe headache.  Denies any significant neck pain.  No history of stroke.  Not on blood thinners.       Prior to Admission medications   Medication Sig Start Date End Date Taking? Authorizing Provider  glipiZIDE (GLUCOTROL XL) 10 MG 24 hr tablet Take 10 mg by mouth daily with breakfast.    [provider]  sitaGLIPtin-metformin (JANUMET) 50-500 MG tablet Take 2 tablets by mouth daily.    [provider]    Allergies: Antihistamines, chlorpheniramine-type; Latex; and Pseudoephedrine hcl    Review of Systems  Updated Vital Signs BP (!) 175/82   Pulse 96   Temp 98.4 F (36.9 C)   Resp 14   Ht 5' 4 (1.626 m)   Wt 98.9 kg   SpO2 100%   BMI 37.42 kg/m   Physical Exam Vitals and nursing note reviewed.  Constitutional:      General: She is not in acute distress.    Appearance: She is well-developed.  HENT:     Head: Normocephalic and atraumatic.     Right Ear: External ear normal.     Left Ear: External ear normal.     Nose: Nose normal.     Mouth/Throat:     Mouth: Mucous membranes are moist.     Pharynx: Oropharynx is clear.     Comments: Uvula midline.  No significant tonsillar swelling. Eyes:     Extraocular Movements: Extraocular movements intact.      Conjunctiva/sclera: Conjunctivae normal.     Pupils: Pupils are equal, round, and reactive to light.  Cardiovascular:     Rate and Rhythm: Normal rate and regular rhythm.     Heart sounds: No murmur heard. Pulmonary:     Effort: Pulmonary effort is normal. No respiratory distress.     Breath sounds: Normal breath sounds.  Musculoskeletal:     Cervical back: Normal range of motion and neck supple.     Right lower leg: No edema.     Left lower leg: No edema.  Skin:    General: Skin is warm and dry.  Neurological:     Mental Status: She is alert and oriented to person, place, and time. Mental status is at baseline.     Comments: NIHSS Exam  Level of Consciousness: Alert  LOC Questions: Answers Month and Age Correctly  LOC Commands: Opens and Closes Eyes and Hands on command  Best Gaze: Horizontal ocular movements intact  Visual Fields: No visual field loss  Facial Palsy: None  L Upper Extremity Motor: No drift after 10 seconds  R Upper Extremity Motor: No drift after 10 seconds  L Lower extremity Motor: No drift after 5 seconds  R Lower extremity Motor: No  drift after 5 seconds  Ataxia: Absent  Sensory: Intact sensation to light touch on face, arms, trunk, and legs bilaterally  Best Language: No aphasia  Dysarthria: No dysarthria  Neglect: No visual or sensory neglect   Psychiatric:        Mood and Affect: Mood normal.     (all labs ordered are listed, but only abnormal results are displayed) Labs Reviewed  CBC - Abnormal; Notable for the following components:      Result Value   WBC 11.0 (*)    HCT 35.4 (*)    All other components within normal limits  COMPREHENSIVE METABOLIC PANEL WITH GFR - Abnormal; Notable for the following components:   Sodium 134 (*)    CO2 19 (*)    Glucose, Bld 232 (*)    Anion gap 17 (*)    All other components within normal limits  CBG MONITORING, ED - Abnormal; Notable for the following components:   Glucose-Capillary 212 (*)    All other  components within normal limits  RESP PANEL BY RT-PCR (RSV, FLU A&B, COVID)  RVPGX2  PROTIME-INR  APTT  DIFFERENTIAL  ETHANOL  CBG MONITORING, ED    EKG: None  Radiology: DG Chest 2 View Result Date: 06/08/2024 CLINICAL DATA:  cough EXAM: CHEST - 2 VIEW COMPARISON:  September 25, 2009 FINDINGS: No focal airspace consolidation, pleural effusion, or pneumothorax. No cardiomegaly. Tortuous aorta with aortic atherosclerosis. No acute fracture or destructive lesions. Multilevel thoracic osteophytosis. Bilateral AC joint osteoarthritis. IMPRESSION: No acute cardiopulmonary abnormality. Electronically Signed   By: Rogelia Myers M.D.   On: 06/08/2024 16:26   MR BRAIN WO CONTRAST Result Date: 06/08/2024 EXAM: MRI BRAIN WITHOUT CONTRAST 06/08/2024 03:58:53 PM TECHNIQUE: Multiplanar multisequence MRI of the head/brain was performed without the administration of intravenous contrast. COMPARISON: None available. CLINICAL HISTORY: Dysarthria, right facial droop. Patient arrived POV caox4 complaining of slurred speech stating her speech has been abnormal since yesterday but thought it would just get better. Last known well Wednesday night. Denies numbness/weakness in extremities. Denies any recent trauma to the head. FINDINGS: BRAIN AND VENTRICLES: Acute nonhemorrhagic infarct in the left lentiform nucleus and corona radiata measures up to 11 mm on the coronal diffusion images. T2 and FLAIR signal is associated. Periventricular and subcortical T2 hyperintensities are mildly advanced for age, worse on the left. More remote lacunar infarcts are present in the left corona radiata. No intracranial hemorrhage. No mass. No midline shift. No hydrocephalus. The sella is unremarkable. Normal flow voids. ORBITS: No acute abnormality. SINUSES AND MASTOIDS: Mild mucosal thickening is present in the left maxillary sinus and ethmoid air cells. Small mastoid effusions are present. BONES AND SOFT TISSUES: Normal marrow signal.  No acute soft tissue abnormality. IMPRESSION: 1. Acute nonhemorrhagic infarct in the left lentiform nucleus and corona radiata, measuring up to 11 mm. 2. Mildly advanced periventricular and subcortical T2 hyperintensities for age, worse on the left. 3. More remote lacunar infarcts in the left corona radiata. Cortical values were called to Dr. Yolande at 4:25 pm. Electronically signed by: Lonni Necessary MD 06/08/2024 04:26 PM EDT RP Workstation: [id number]   CT HEAD WO CONTRAST Result Date: 06/08/2024 EXAM: CT HEAD WITHOUT CONTRAST 06/08/2024 03:15:00 PM TECHNIQUE: CT of the head was performed without the administration of intravenous contrast. Automated exposure control, iterative reconstruction, and/or weight based adjustment of the mA/kV was utilized to reduce the radiation dose to as low as reasonably achievable. COMPARISON: None available. CLINICAL HISTORY: Neuro deficit, acute, stroke suspected. Pt  states been sick (cough/running nose), the slurred speech seemed to start yesterday at some point-pt could not say. Noted right sided facial droop and drooling-patient willing/able to stand and said she did not feel any different but is unable to talk the same. FINDINGS: BRAIN AND VENTRICLES: No acute hemorrhage. No evidence of acute infarct. No hydrocephalus. No extra-axial collection. No mass effect or midline shift. Mild atrophy is within normal limits for age. ORBITS: No acute abnormality. SINUSES: Mild mucosal thickening is present within the left maxillary sinus and ethmoid air cells. SOFT TISSUES AND SKULL: No acute soft tissue abnormality. No skull fracture. IMPRESSION: 1. No acute intracranial abnormality. 2. Mild mucosal thickening in the left maxillary sinus and ethmoid air cells. Electronically signed by: Lonni Necessary MD 06/08/2024 03:25 PM EDT RP Workstation: [id number]     Procedures   Medications Ordered in the ED  sodium chloride  flush (NS) 0.9 % injection 3 mL ( Intravenous  Canceled Entry 06/08/24 1623)  aspirin  chewable tablet 324 mg (has no administration in time range)  clopidogrel  (PLAVIX ) tablet 300 mg (has no administration in time range)  iohexol  (OMNIPAQUE ) 350 MG/ML injection 75 mL (has no administration in time range)    Clinical Course as of 06/08/24 1701  Fri Jun 08, 2024  1643 Discussed with Dr. Matthews from neurology.  Recommends admission to [facility] for stroke workup [RP]  [address] [place] from hospitalist [RP]    Clinical Course User Index [RP] Yolande Lamar BROCKS, MD                                 Medical Decision Making Amount and/or Complexity of Data Reviewed Labs: ordered. Radiology: ordered.  Risk OTC drugs. Prescription drug management. Decision regarding hospitalization.   Hailey Johnson is a 78 year old female with a history of diabetes, hypertension, and hyperlipidemia presents emergency department difficulty speaking.   Initial Ddx:  Stroke, ICH, hypoglycemia, URI, PTA  MDM/Course:  Patient presents emergency department with dysarthria.  Unfortunately her symptoms started over 24 hours ago so is not a candidate for intervention.  On exam has dysarthria no facial droop.  No aphasia.  CT head unremarkable.  MRI did show corona radiata stroke that is acute.  She was given aspirin  and Plavix .  Discussed with neurology recommended admission to [facility] for stroke evaluation.  Vessel imaging ordered.  Hospitalist consulted for admission.  With her URI I did obtain a COVID and flu which is pending at this time.  Chest x-ray without pneumonia.  No obvious signs of pharyngeal infection that would cause dysarthria  This patient presents to the ED for concern of complaints listed in HPI, this involves an extensive number of treatment options, and is a complaint that carries with it a high risk of complications and morbidity. Disposition including potential need for admission considered.   Dispo: Admit to Floor  Additional  history obtained from spouse Records reviewed Outpatient Clinic Notes The following labs were independently interpreted: Chemistry and show Hyperglycemia I independently reviewed the following imaging with scope of interpretation limited to determining acute life threatening conditions related to emergency care: CT Head and agree with the radiologist interpretation with the following exceptions: none I personally reviewed and interpreted cardiac monitoring: normal sinus rhythm  I personally reviewed and interpreted the pt's EKG: see above for interpretation  I have reviewed the patients home medications and made adjustments as needed Consults: Hospitalist and Neurology  Social Determinants of health:  Geriatric  Portions of this note were generated with Scientist, clinical (histocompatibility and immunogenetics). Dictation errors may occur despite best attempts at proofreading.     Final diagnoses:  Cerebrovascular accident (CVA), unspecified mechanism (HCC)  Dysarthria  Upper respiratory tract infection, unspecified type    ED Discharge Orders     None          Yolande Lamar BROCKS, MD 06/08/24 1658

## 2024-06-08 NOTE — Plan of Care (Signed)

## 2024-06-08 NOTE — ED Triage Notes (Signed)
 Pt arrived POV caox4 c/o slurred speech stating her speech has been abnormal since yesterday but thought it would just get better.  LKW Wednesday night. Denies numbness/weakness in extremities. Denies any recent trauma to the head.

## 2024-06-09 ENCOUNTER — Other Ambulatory Visit (HOSPITAL_COMMUNITY)

## 2024-06-09 ENCOUNTER — Inpatient Hospital Stay (HOSPITAL_COMMUNITY)

## 2024-06-09 DIAGNOSIS — E1151 Type 2 diabetes mellitus with diabetic peripheral angiopathy without gangrene: Secondary | ICD-10-CM

## 2024-06-09 DIAGNOSIS — I639 Cerebral infarction, unspecified: Secondary | ICD-10-CM

## 2024-06-09 DIAGNOSIS — I7090 Unspecified atherosclerosis: Secondary | ICD-10-CM

## 2024-06-09 DIAGNOSIS — I6389 Other cerebral infarction: Secondary | ICD-10-CM

## 2024-06-09 DIAGNOSIS — I10 Essential (primary) hypertension: Secondary | ICD-10-CM

## 2024-06-09 LAB — LIPID PANEL
Cholesterol: 166 mg/dL (ref 0–200)
HDL: 49 mg/dL (ref >40)
LDL Cholesterol: 82 mg/dL (ref 0–99)
Total CHOL/HDL Ratio: 3.4 ratio
Triglycerides: 173 mg/dL — ABNORMAL HIGH (ref <150)
VLDL: 35 mg/dL (ref 0–40)

## 2024-06-09 LAB — HEMOGLOBIN A1C
Hgb A1c MFr Bld: 8 % — ABNORMAL HIGH (ref 4.8–5.6)
Mean Plasma Glucose: 182.9 mg/dL

## 2024-06-09 LAB — GLUCOSE, CAPILLARY
Glucose-Capillary: 234 mg/dL — ABNORMAL HIGH (ref 70–99)
Glucose-Capillary: 213 mg/dL — ABNORMAL HIGH (ref 70–99)
Glucose-Capillary: 224 mg/dL — ABNORMAL HIGH (ref 70–99)
Glucose-Capillary: 239 mg/dL — ABNORMAL HIGH (ref 70–99)

## 2024-06-09 LAB — CBC
HCT: 33.5 % — ABNORMAL LOW (ref 36.0–46.0)
Hemoglobin: 11.2 g/dL — ABNORMAL LOW (ref 12.0–15.0)
MCH: 29.2 pg (ref 26.0–34.0)
MCHC: 33.4 g/dL (ref 30.0–36.0)
MCV: 87.2 fL (ref 80.0–100.0)
Platelets: 357 K/uL (ref 150–400)
RBC: 3.84 MIL/uL — ABNORMAL LOW (ref 3.87–5.11)
RDW: 12.5 % (ref 11.5–15.5)
WBC: 12 K/uL — ABNORMAL HIGH (ref 4.0–10.5)
nRBC: 0 % (ref 0.0–0.2)

## 2024-06-09 LAB — CREATININE, SERUM
Creatinine, Ser: 0.97 mg/dL (ref 0.44–1.00)
GFR, Estimated: >60 mL/min (ref >60)

## 2024-06-09 MED ORDER — ALUM & MAG HYDROXIDE-SIMETH 200-200-20 MG/5ML PO SUSP: 30.0000 mL | ORAL | Status: DC | PRN

## 2024-06-09 MED ORDER — CLOPIDOGREL BISULFATE 75 MG PO TABS
75.0000 mg | ORAL_TABLET | Freq: Every day | ORAL | Status: DC
  Administered 2024-06-09 – 2024-06-11 (×3): 75 mg via ORAL
  Filled 2024-06-09 (×3): qty 1

## 2024-06-09 MED ORDER — POLYVINYL ALCOHOL 1.4 % OP SOLN: 1.0000 [drp] | OPHTHALMIC | Status: DC | PRN

## 2024-06-09 MED ORDER — ASPIRIN 81 MG PO CHEW
81.0000 mg | CHEWABLE_TABLET | Freq: Every day | ORAL | Status: DC
  Administered 2024-06-09 – 2024-06-11 (×3): 81 mg via ORAL
  Filled 2024-06-09 (×3): qty 1

## 2024-06-09 MED ORDER — SALINE SPRAY 0.65 % NA SOLN
1.0000 | NASAL | Status: DC | PRN
  Filled 2024-06-09: qty 44

## 2024-06-09 MED ORDER — ACETAMINOPHEN 325 MG PO TABS: 650.0000 mg | ORAL_TABLET | ORAL | Status: DC | PRN

## 2024-06-09 MED ORDER — ACETAMINOPHEN 160 MG/5ML PO SOLN: 650.0000 mg | ORAL | Status: DC | PRN

## 2024-06-09 MED ORDER — PHENOL 1.4 % MT LIQD
1.0000 | OROMUCOSAL | Status: DC | PRN
  Filled 2024-06-09: qty 177

## 2024-06-09 MED ORDER — ACETAMINOPHEN 650 MG RE SUPP: 650.0000 mg | RECTAL | Status: DC | PRN

## 2024-06-09 MED ORDER — HYDRALAZINE HCL 20 MG/ML IJ SOLN: 10.0000 mg | Freq: Four times a day (QID) | INTRAMUSCULAR | Status: DC | PRN

## 2024-06-09 MED ORDER — SENNOSIDES-DOCUSATE SODIUM 8.6-50 MG PO TABS: 1.0000 | ORAL_TABLET | Freq: Every evening | ORAL | Status: DC | PRN

## 2024-06-09 MED ORDER — HEPARIN SODIUM (PORCINE) 5000 UNIT/ML IJ SOLN
5000.0000 [IU] | Freq: Three times a day (TID) | INTRAMUSCULAR | Status: DC
  Administered 2024-06-09 – 2024-06-11 (×7): 5000 [IU] via SUBCUTANEOUS
  Filled 2024-06-09 (×7): qty 1

## 2024-06-09 MED ORDER — STROKE: EARLY STAGES OF RECOVERY BOOK
Freq: Once | Status: AC
  Filled 2024-06-09: qty 1

## 2024-06-09 MED ORDER — CARMEX CLASSIC LIP BALM EX OINT
1.0000 | TOPICAL_OINTMENT | CUTANEOUS | Status: DC | PRN
  Filled 2024-06-09: qty 10

## 2024-06-09 MED ORDER — ORAL CARE MOUTH RINSE: 15.0000 mL | OROMUCOSAL | Status: DC | PRN

## 2024-06-09 MED ORDER — INSULIN ASPART 100 UNIT/ML IJ SOLN
0.0000 [IU] | Freq: Three times a day (TID) | INTRAMUSCULAR | Status: DC
  Administered 2024-06-09 (×3): 3 [IU] via SUBCUTANEOUS

## 2024-06-09 MED ORDER — GUAIFENESIN-DM 100-10 MG/5ML PO SYRP: 5.0000 mL | ORAL_SOLUTION | ORAL | Status: DC | PRN

## 2024-06-09 MED ORDER — LORATADINE 10 MG PO TABS: 10.0000 mg | ORAL_TABLET | Freq: Every day | ORAL | Status: DC | PRN

## 2024-06-09 MED ORDER — SODIUM CHLORIDE 0.9 % IV SOLN
INTRAVENOUS | Status: DC
Start: 2024-06-09 — End: 2024-06-10

## 2024-06-09 MED ORDER — ROSUVASTATIN CALCIUM 20 MG PO TABS
20.0000 mg | ORAL_TABLET | Freq: Every day | ORAL | Status: DC
  Administered 2024-06-09 – 2024-06-11 (×3): 20 mg via ORAL
  Filled 2024-06-09 (×3): qty 1

## 2024-06-09 NOTE — H&P (Signed)
 History and Physical    Hailey Johnson FMW:[id number] DOB: 1946/09/16 DOA: 06/08/2024  PCP: Dyane Anthony RAMAN, FNP  Patient coming from: [facility]  I have personally briefly reviewed patient's old medical records in [facility]  Chief Complaint: slurred speech x 1 day  HPI: Hailey Johnson is a 78 y.o. female with medical history significant of hypertension, obesity DMII,,and hyperlipidemia Who presents to ED with complaint of slurred speech x 24 hours. Patient noted the last time her speech was note to be normal was going to bed on 2 nights ago.  Patient notes no HA, vision changes, difficulty swallowing, focal weakness or paresthesias.  ON further ros patient also denies CP / n/v/d/ fever/ chills.    ED Course:  In ED patient was evluated for possible CVA. CTH noted no acute findings,however  MRI was + for Acute nonhemorrhagic infarct in the left lentiform nucleus and corona radiata, measuring up to 11 mm. Patient discussed with neurology treated with ASA/plavix   and slated for transfer to [facility] for further CVA evaluation. Neuro also recommended permissive HTN x 48hours.   Vitals:  Afeb bp 181/114, hr 103, rr 18 sat 98%  Wbc 11, gb 12.2 , plt 379 Na 134, K 4.4, cr 0.92 c, gluc 232 CTH IMPRESSION: 1. No acute intracranial abnormality. 2. Mild mucosal thickening in the left maxillary sinus and ethmoid air cells.  MRI IMPRESSION: 1. Acute nonhemorrhagic infarct in the left lentiform nucleus and corona radiata, measuring up to 11 mm. 2. Mildly advanced periventricular and subcortical T2 hyperintensities for age, worse on the left. 3. More remote lacunar infarcts in the left corona radiata. Cortical values were called to Dr. Yolande at 4:25 pm. Cxr nad  RVP -neg Tx ASA and plavix  Review of Systems: As per HPI otherwise 10 point review of systems negative.   History reviewed. No pertinent past medical history.  History reviewed. No pertinent surgical history.   reports that she  has never smoked. She has never used smokeless tobacco. She reports that she does not drink alcohol  and does not use drugs.  Allergies  Allergen Reactions   Antihistamines, Chlorpheniramine-Type Palpitations   Latex Dermatitis   Pseudoephedrine Hcl Palpitations    History reviewed. No pertinent family history.  Prior to Admission medications   Medication Sig Start Date End Date Taking? Authorizing Provider  glipiZIDE (GLUCOTROL XL) 10 MG 24 hr tablet Take 10 mg by mouth daily with breakfast.    [provider]  sitaGLIPtin-metformin (JANUMET) 50-500 MG tablet Take 2 tablets by mouth daily.    [provider]    Physical Exam: Vitals:   06/08/24 1930 06/08/24 2217 06/08/24 2226 06/08/24 2326  BP: (!) 179/83  (!) 185/69 (!) 158/71  Pulse: 99  99 (!) 104  Resp: 19  16 17   Temp:   97.6 F (36.4 C) 98.7 F (37.1 C)  TempSrc:   Temporal Oral  SpO2: 100%  96% 99%  Weight:  97.8 kg    Height:  5' 4 (1.626 m)      Constitutional: NAD, calm, comfortable Vitals:   06/08/24 1930 06/08/24 2217 06/08/24 2226 06/08/24 2326  BP: (!) 179/83  (!) 185/69 (!) 158/71  Pulse: 99  99 (!) 104  Resp: 19  16 17   Temp:   97.6 F (36.4 C) 98.7 F (37.1 C)  TempSrc:   Temporal Oral  SpO2: 100%  96% 99%  Weight:  97.8 kg    Height:  5' 4 (1.626 m)  Eyes: PERRL, lids and conjunctivae normal ENMT: Mucous membranes are moist. Posterior pharynx clear of any exudate or lesions.Normal dentition.  Neck: normal, supple, no masses, no thyromegaly Respiratory: clear to auscultation bilaterally, no wheezing, no crackles. Normal respiratory effort. No accessory muscle use.  Cardiovascular: Regular rate and rhythm, no murmurs / rubs / gallops. No extremity edema. 2+ pedal pulses.  Abdomen: no tenderness, no masses palpated. No hepatosplenomegaly. Bowel sounds positive.  Musculoskeletal: no clubbing / cyanosis. No joint deformity upper and lower extremities. Good ROM, no contractures.  Normal muscle tone.  Skin: no rashes, lesions, ulcers. No induration Neurologic: + slurred speech and facial droop . Sensation intact, . Strength 5/5 in all 4.  Psychiatric: Normal judgment and insight. Alert and oriented x 3. Normal mood.    Labs on Admission: I have personally reviewed following labs and imaging studies  CBC: Recent Labs  Lab 06/08/24 1451  WBC 11.0*  NEUTROABS 7.1  HGB 12.2  HCT 35.4*  MCV 87.2  PLT 379   Basic Metabolic Panel: Recent Labs  Lab 06/08/24 1451  NA 134*  K 4.4  CL 98  CO2 19*  GLUCOSE 232*  BUN 14  CREATININE 0.92  CALCIUM  9.9   GFR: Estimated Creatinine Clearance: 59 mL/min (by C-G formula based on SCr of 0.92 mg/dL). Liver Function Tests: Recent Labs  Lab 06/08/24 1451  AST 16  ALT 13  ALKPHOS 115  BILITOT 0.3  PROT 7.2  ALBUMIN 4.1   No results for input(s): LIPASE, AMYLASE in the last 168 hours. No results for input(s): AMMONIA in the last 168 hours. Coagulation Profile: Recent Labs  Lab 06/08/24 1451  INR 1.0   Cardiac Enzymes: No results for input(s): CKTOTAL, CKMB, CKMBINDEX, TROPONINI in the last 168 hours. BNP (last 3 results) No results for input(s): PROBNP in the last 8760 hours. HbA1C: No results for input(s): HGBA1C in the last 72 hours. CBG: Recent Labs  Lab 06/08/24 1444  GLUCAP 212*   Lipid Profile: No results for input(s): CHOL, HDL, LDLCALC, TRIG, CHOLHDL, LDLDIRECT in the last 72 hours. Thyroid Function Tests: No results for input(s): TSH, T4TOTAL, FREET4, T3FREE, THYROIDAB in the last 72 hours. Anemia Panel: No results for input(s): VITAMINB12, FOLATE, FERRITIN, TIBC, IRON, RETICCTPCT in the last 72 hours. Urine analysis: No results found for: COLORURINE, APPEARANCEUR, LABSPEC, PHURINE, GLUCOSEU, HGBUR, BILIRUBINUR, KETONESUR, PROTEINUR, UROBILINOGEN, NITRITE, LEUKOCYTESUR  Radiological Exams on Admission: CT ANGIO  HEAD NECK W WO CM Result Date: 06/08/2024 EXAM: CT HEAD WITHOUT CTA HEAD AND NECK WITH AND WITHOUT 06/08/2024 05:05:43 PM TECHNIQUE: CTA of the head and neck was performed with and without the administration of intravenous contrast. Noncontrast CT of the head with reconstructed 2-D images are also provided for review. Multiplanar 2D and/or 3D reformatted images are provided for review. Automated exposure control, iterative reconstruction, and/or weight based adjustment of the mA/kV was utilized to reduce the radiation dose to as low as reasonably achievable. COMPARISON: Same day CT head and MRI head CLINICAL HISTORY: Stroke dysarthria. FINDINGS: CT HEAD: BRAIN AND VENTRICLES: No acute intracranial hemorrhage. No mass effect. No midline shift. No extra-axial fluid collection. No evidence of acute infarct. No hydrocephalus. ORBITS: No acute abnormality. SINUSES AND MASTOIDS: No acute abnormality. CTA NECK: AORTIC ARCH AND ARCH VESSELS: Mild atherosclerosis of the aortic arch. CERVICAL CAROTID ARTERIES: The right carotid artery is patent from the origin to the skull base. Atherosclerosis of the distal right common carotid artery resulting in mild stenosis. Additional atherosclerosis at the carotid  bifurcation and proximal right cervical ICA resulting in approximately 60% stenosis of the proximal right cervical ICA. The left carotid artery is patent from the origin to the skull base. Mild atherosclerosis at the carotid bifurcation. There is additional nearly circumferential atherosclerotic plaque along the proximal left cervical ICA. There is approximately 50% stenosis of the proximal cervical ICA just proximal to this point of maximal narrowing. 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 The vessels course through the skull base through separate canals and then join at the posterior genu of the petrous ICA. CERVICAL VERTEBRAL ARTERIES: The vertebral arteries are patent from the  origins to the vertebrobasilar confluence. Tortuosity of the left V1 segment. Atherosclerosis of the left V4 segment resulting in mild stenosis. LUNGS AND MEDIASTINUM: Unremarkable. SOFT TISSUES: No acute abnormality. BONES: Degenerative changes in the visualized spine. Prominent disc osteophyte complex is most pronounced at C5-6, resulting in at least moderate spinal canal stenosis. CTA HEAD: ANTERIOR CIRCULATION: The intracranial internal carotid arteries are patent bilaterally. Mild atherosclerosis of the carotid siphons without hemodynamically significant stenosis. There is irregularity and moderate narrowing of a proximal M2 branch of the left MCA. The middle cerebral arteries are patent bilaterally. Hypoplastic right A1 segment. The anterior cerebral arteries are patent. There is moderate-to-severe long segment narrowing of the right ACA from the distal A2 segment to the proximal A4 segment. POSTERIOR CIRCULATION: The posterior cerebral arteries are patent bilaterally, with irregularity and moderate narrowing of the anterior P2 segment of the left PCA. OTHER: No dural venous sinus thrombosis on this non-dedicated study. IMPRESSION: 1. No large vessel occlusion. 2. Irregularity and moderate narrowing of a proximal M2 branch of the left MCA. 3. Moderate-to-severe long segment narrowing of the right ACA from the distal A2 segment to the proximal A4 segment. 4. Irregularity and moderate narrowing of the anterior P2 segment of the left PCA. 5. Atherosclerosis at the right carotid bifurcation and proximal right cervical ICA resulting in approximately 60% stenosis. 6. Mild atherosclerosis at the left carotid bifurcation and nearly circumferential atherosclerotic plaque along the proximal left cervical ICA resulting in approximately 50% stenosis. 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 Electronically signed by: Donnice Mania MD 06/08/2024 05:45 PM EDT RP Workstation: [id number]   DG  Chest 2 View Result Date: 06/08/2024 CLINICAL DATA:  cough EXAM: CHEST - 2 VIEW COMPARISON:  September 25, 2009 FINDINGS: No focal airspace consolidation, pleural effusion, or pneumothorax. No cardiomegaly. Tortuous aorta with aortic atherosclerosis. No acute fracture or destructive lesions. Multilevel thoracic osteophytosis. Bilateral AC joint osteoarthritis. IMPRESSION: No acute cardiopulmonary abnormality. Electronically Signed   By: Rogelia Myers M.D.   On: 06/08/2024 16:26   MR BRAIN WO CONTRAST Result Date: 06/08/2024 EXAM: MRI BRAIN WITHOUT CONTRAST 06/08/2024 03:58:53 PM TECHNIQUE: Multiplanar multisequence MRI of the head/brain was performed without the administration of intravenous contrast. COMPARISON: None available. CLINICAL HISTORY: Dysarthria, right facial droop. Patient arrived POV caox4 complaining of slurred speech stating her speech has been abnormal since yesterday but thought it would just get better. Last known well Wednesday night. Denies numbness/weakness in extremities. Denies any recent trauma to the head. FINDINGS: BRAIN AND VENTRICLES: Acute nonhemorrhagic infarct in the left lentiform nucleus and corona radiata measures up to 11 mm on the coronal diffusion images. T2 and FLAIR signal is associated. Periventricular and subcortical T2 hyperintensities are mildly advanced for age, worse on the left. More remote lacunar infarcts are present in the left corona radiata. No intracranial  hemorrhage. No mass. No midline shift. No hydrocephalus. The sella is unremarkable. Normal flow voids. ORBITS: No acute abnormality. SINUSES AND MASTOIDS: Mild mucosal thickening is present in the left maxillary sinus and ethmoid air cells. Small mastoid effusions are present. BONES AND SOFT TISSUES: Normal marrow signal. No acute soft tissue abnormality. IMPRESSION: 1. Acute nonhemorrhagic infarct in the left lentiform nucleus and corona radiata, measuring up to 11 mm. 2. Mildly advanced periventricular  and subcortical T2 hyperintensities for age, worse on the left. 3. More remote lacunar infarcts in the left corona radiata. Cortical values were called to Dr. Yolande at 4:25 pm. Electronically signed by: Lonni Necessary MD 06/08/2024 04:26 PM EDT RP Workstation: [id number]   CT HEAD WO CONTRAST Result Date: 06/08/2024 EXAM: CT HEAD WITHOUT CONTRAST 06/08/2024 03:15:00 PM TECHNIQUE: CT of the head was performed without the administration of intravenous contrast. Automated exposure control, iterative reconstruction, and/or weight based adjustment of the mA/kV was utilized to reduce the radiation dose to as low as reasonably achievable. COMPARISON: None available. CLINICAL HISTORY: Neuro deficit, acute, stroke suspected. Pt states been sick (cough/running nose), the slurred speech seemed to start yesterday at some point-pt could not say. Noted right sided facial droop and drooling-patient willing/able to stand and said she did not feel any different but is unable to talk the same. FINDINGS: BRAIN AND VENTRICLES: No acute hemorrhage. No evidence of acute infarct. No hydrocephalus. No extra-axial collection. No mass effect or midline shift. Mild atrophy is within normal limits for age. ORBITS: No acute abnormality. SINUSES: Mild mucosal thickening is present within the left maxillary sinus and ethmoid air cells. SOFT TISSUES AND SKULL: No acute soft tissue abnormality. No skull fracture. IMPRESSION: 1. No acute intracranial abnormality. 2. Mild mucosal thickening in the left maxillary sinus and ethmoid air cells. Electronically signed by: Lonni Necessary MD 06/08/2024 03:25 PM EDT RP Workstation: [id number]    EKG: Independently reviewed.   Assessment/Plan  Acute nonhemorrhagic infarct in the left lentiform nucleus and corona radiata -admit to progressive care  -place on stroke protocol  - asa/plavix  per neurology  -CTA head neck pending -echo /slp/PT/OT -f/u with further neuro rec    Hypertension -permissive HTN  -hydralazine  prn  -holding oral medications currently   DMII -iss/fs   HLD -continue statin   Mild leukocytosis -continue to monitor  -check ua  DVT prophylaxis: heparin  Code Status: full/ as discussed per patient wishes in event of cardiac arrest  Family Communication: none at bedside Disposition Plan: patient  expected to be admitted greater than 2 midnights  Consults called: Neurology Dr Merrianne Admission status: progressive care   Camila DELENA Ned MD [facility]   If 7PM-7AM, please contact night-coverage [email or web address] Password [email or web address]  06/09/2024, 12:24 AM

## 2024-06-09 NOTE — Plan of Care (Signed)
  Problem: Education: Goal: Knowledge of disease or condition will improve Outcome: Progressing Goal: Knowledge of patient specific risk factors will improve (DELETE if not current risk factor) Outcome: Progressing   Problem: Ischemic Stroke/TIA Tissue Perfusion: Goal: Complications of ischemic stroke/TIA will be minimized Outcome: Progressing

## 2024-06-09 NOTE — Progress Notes (Signed)
 VASCULAR LAB    Carotid duplex has been performed.  See CV proc for preliminary results.   Zanovia Rotz, RVT 06/09/2024, 3:14 PM

## 2024-06-09 NOTE — Progress Notes (Signed)
 Inpatient Rehab Admissions Coordinator:   Per therapy recommendations,  patient was screened for CIR candidacy by Leita Kleine, MS, CCC-SLP . Pt. Is already contact guard-supervision level on eval, and I believe she will likely progress to being able to d/c home. Will rescreen on Monday and place consult if deficits persist or worsen. Please contact me with any questions.   Leita Kleine, MS, CCC-SLP Rehab Admissions Coordinator  [phone] (celll) [phone] (office)

## 2024-06-09 NOTE — Evaluation (Signed)
 Occupational Therapy Evaluation Patient Details Name: Hailey Johnson MRN: [id number] DOB: 05-18-1946 Today's Date: 06/09/2024   History of Present Illness   Pt is a 78 y.o. female who presented to [facility] on 06/08/24 with c/o slurred speech x24 hours. 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 Pt with minimal right-sided facial droop, dysarthria, on DAPT, LDL was above goal with statin added, A1c high as well, diabetic education provided. PMH: HTN, DMII, HLD, obesity     Clinical Impressions At baseline, pt is Independent with ADLs, IADLs, and functional mobility, and drives. Pt now presents with decreased cognition, decreased R UE fine motor coordination, visual deficits (see details below), decreased balance, and decreased safety and independence with functional tasks. Pt currently demonstrates ability to largely complete ADLs with Set up to Min assist with increased time and effort, bed mobility with Supervision, and functional mobility with a RW with Supervision to Contact guard assist for safety. Pt participated well is session, is motivated to return to [facility], and has good family support. Pt will benefit from acute skilled OT services to address deficits and increase safety and independence with functional tasks. Post acute discharge, pt will benefit from intensive inpatient skilled rehab services > 3 hours per day to maximize rehab potential.      If plan is discharge home, recommend the following:   A little help with walking and/or transfers;A little help with bathing/dressing/bathroom;Assistance with cooking/housework;Direct supervision/assist for medications management;Direct supervision/assist for financial management;Help with stairs or ramp for entrance;Assist for transportation     Functional Status Assessment   Patient has had a recent decline in their functional status and  demonstrates the ability to make significant improvements in function in a reasonable and predictable amount of time.     Equipment Recommendations   Other (comment) (RW)     Recommendations for Other Services   Rehab consult     Precautions/Restrictions   Precautions Precautions: Fall Restrictions Weight Bearing Restrictions Per Provider Order: No     Mobility Bed Mobility Overal bed mobility: Needs Assistance Bed Mobility: Supine to Sit     Supine to sit: Supervision     General bed mobility comments: No assist required to transition around and get feet on the floor. Pt required increased time and min use of rails.    Transfers Overall transfer level: Needs assistance Equipment used: None, Rolling walker (2 wheels) Transfers: Sit to/from Stand, Bed to chair/wheelchair/BSC Sit to Stand: Supervision (with no AD)     Step pivot transfers: Supervision, Contact guard assist     General transfer comment: Pt able to stand EOB without assist. She stood quickly to adjust gown and sat back down without difficulty. Benefits from RW in stepping/ambulation      Balance Overall balance assessment: Needs assistance Sitting-balance support: No upper extremity supported, Feet supported Sitting balance-Leahy Scale: Good     Standing balance support: No upper extremity supported, Bilateral upper extremity supported, Single extremity supported, During functional activity Standing balance-Leahy Scale: Fair Standing balance comment: able to maintain static standing balance without support; benefits from UE support in dydnamic standing/stepping                           ADL either performed or assessed with clinical judgement   ADL Overall ADL's : Needs assistance/impaired Eating/Feeding: Set up;Sitting Eating/Feeding Details (indicate cue type and reason): Pt  requiring increased time and assistance for opening packages. Pt also report she has to really think  about waht she's doing. Grooming: Set up;Contact guard assist;Sitting   Upper Body Bathing: Contact guard assist;Minimal assistance;Sitting   Lower Body Bathing: Supervison/ safety;Contact guard assist;Sit to/from stand   Upper Body Dressing : Contact guard assist;Sitting   Lower Body Dressing: Contact guard assist;Sit to/from stand   Toilet Transfer: Contact guard assist;Minimal assistance;Ambulation;Regular Toilet;Rolling walker (2 wheels)   Toileting- Clothing Manipulation and Hygiene: Supervision/safety;Contact guard assist;Sit to/from stand       Functional mobility during ADLs: Contact guard assist;Minimal assistance;Rolling walker (2 wheels) General ADL Comments: Pt requiring increased time and effort for all tasks     Vision Baseline Vision/History: 4 Cataracts (on Left) Ability to See in Adequate Light: 1 Impaired (at baseline) Patient Visual Report: Other (comment) (reports changes but it's difficult to explain; reports no diplopia and no blurring of vision) Vision Assessment?: Yes Eye Alignment: Within Functional Limits Ocular Range of Motion: Within Functional Limits Alignment/Gaze Preference: Within Defined Limits Tracking/Visual Pursuits: Decreased smoothness of horizontal tracking;Unable to hold eye position out of midline;Impaired - to be further tested in functional context Saccades: Additional eye shifts occurred during testing Convergence: Impaired (comment) (Impaired Bilaterally) Visual Fields: Right visual field deficit;Left visual field deficit Diplopia Assessment:  (pt reports no diplopia) Depth Perception:  [facility]) Additional Comments: Pt with difficultly accurately touching small targets with finger (i.e.numbers or letters on her cell phone)     Perception         Praxis         Pertinent Vitals/Pain Pain Assessment Pain Assessment: No/denies pain (reports history of knee pain)     Extremity/Trunk Assessment Upper Extremity Assessment Upper  Extremity Assessment: Defer to OT evaluation RUE Deficits / Details: strength and ROM WFL; gross motor coordination largely WFL; decreased proprioception; decreased fine motor coordination RUE Sensation: decreased proprioception RUE Coordination: decreased fine motor   Lower Extremity Assessment Lower Extremity Assessment: Generalized weakness (equal R and L, however with some coordination deficits R>L.)   Cervical / Trunk Assessment Cervical / Trunk Assessment: Normal   Communication Communication Communication: Impaired Factors Affecting Communication: Reduced clarity of speech   Cognition Arousal: Alert Behavior During Therapy: Flat affect Cognition: Cognition impaired     Awareness: Intellectual awareness intact, Online awareness intact (intermittent online awareness) Memory impairment (select all impairments): Working memory Attention impairment (select first level of impairment): Alternating attention Executive functioning impairment (select all impairments): Organization, Reasoning, Problem solving OT - Cognition Comments: Pt AAOx4 and pleasant throughout session. Pt with poor insiht into deficits and noted deficits in reasoning and problem solving throughout session. Pt with difficulty completing familiar multi-step tasks. For example, pt requiring three attempts to successfully place lunch order via phone call using personal cell phone. OT plans to further assess cognition in functional context in future sessions.                 Following commands: Intact       Cueing  General Comments   Cueing Techniques: Verbal cues;Visual cues;Gestural cues  Pt's friend present and supportive throughout session. PT arriving at end of session   Exercises     Shoulder Instructions      Home Living Family/patient expects to be discharged to:: Private residence Living Arrangements: Spouse/significant other Available Help at Discharge: Friend(s);Family;Available  PRN/intermittently (husband able to physically assist however does not drive; daughter and son can assist PRN) Type of Home: House Home Access: Stairs to  enter (usually go in from garage) Entrance Stairs-Number of Steps: 2+10 (from garage; 6+2 at back deck;) Entrance Stairs-Rails: Left (both on the 6 back stairs) Home Layout: Multi-level;Able to live on main level with bedroom/bathroom (garage in basement and parking area behind house) Alternate Level Stairs-Number of Steps: flight   Bathroom Shower/Tub: Estate manager/land agent Accessibility: Yes   Home Equipment: Shower seat - built in;Grab bars - tub/shower;Hand held shower head   Additional Comments: husband does not drive      Prior Functioning/Environment Prior Level of Function : Independent/Modified Independent             Mobility Comments: Ind ADLs Comments: Ind; babysits 1 day a week for grandkids; quilts    OT Problem List: Decreased activity tolerance;Impaired balance (sitting and/or standing);Decreased coordination;Decreased cognition;Decreased safety awareness;Decreased knowledge of use of DME or AE;Decreased knowledge of precautions   OT Treatment/Interventions: Self-care/ADL training;Energy conservation;DME and/or AE instruction;Cognitive remediation/compensation;Therapeutic activities;Visual/perceptual remediation/compensation;Patient/family education;Balance training;Therapeutic exercise      OT Goals(Current goals can be found in the care plan section)   Acute Rehab OT Goals Patient Stated Goal: to return home, feel like her usual self, and be able to remain independent OT Goal Formulation: With patient Time For Goal Achievement: 06/23/24 Potential to Achieve Goals: Good ADL Goals Pt Will Perform Grooming: with modified independence;standing Pt Will Perform Lower Body Bathing: with modified independence;sit to/from stand Pt Will Perform Lower Body Dressing: with modified independence;sit to/from  stand Pt Will Transfer to Toilet: with modified independence;ambulating;regular height toilet (with least restrictive AD) Additional ADL Goal #1: Patient will demonstrate ability to accurately set-up a weekly medication planner with Mod I. Additional ADL Goal #2: Patient will demonstrate ability to participate in a home exercise program for improved R UE fine motor coordination following a written HEP with Mod I.   OT Frequency:  Min 2X/week    Co-evaluation              AM-PAC OT 6 Clicks Daily Activity     Outcome Measure Help from another person eating meals?: A Little Help from another person taking care of personal grooming?: A Little Help from another person toileting, which includes using toliet, bedpan, or urinal?: A Little Help from another person bathing (including washing, rinsing, drying)?: A Little Help from another person to put on and taking off regular upper body clothing?: A Little Help from another person to put on and taking off regular lower body clothing?: A Little 6 Click Score: 18   End of Session Equipment Utilized During Treatment: Rolling walker (2 wheels);Gait belt Nurse Communication: Mobility status;Other (comment) (Pt ordered lunch)  Activity Tolerance: Patient tolerated treatment well Patient left: Other (comment) (Pt left in care of PT beginning PT session)  OT Visit Diagnosis: Unsteadiness on feet (R26.81);Other abnormalities of gait and mobility (R26.89);Ataxia, unspecified (R27.0);Other symptoms and signs involving cognitive function;Cognitive communication deficit (R41.841) Symptoms and signs involving cognitive functions: Cerebral infarction                Time: 1208-1250 OT Time Calculation (min): 42 min Charges:  OT General Charges $OT Visit: 1 Visit OT Evaluation $OT Eval Moderate Complexity: 1 Mod OT Treatments $Self Care/Home Management : 8-22 mins $Cognitive Funtion inital: Initial 15 mins  Hailey Rockey HERO., OTR/L, MA Acute  Rehab [phone]   Hailey Johnson 06/09/2024, 2:49 PM

## 2024-06-09 NOTE — Consult Note (Addendum)
 NEUROLOGY CONSULT NOTE   Date of service: June 09, 2024 Patient Name: Hailey Johnson MRN:  [id number] DOB:  09-25-1946 Chief Complaint: Slurred speech Requesting Provider: Dennise Lavada POUR, MD  History of Present Illness  FARRIS BLASH is a 78 y.o. female with a PMHx of DM, HTN and HLD who presented to the [facility] ED on Friday afternoon with slurred speech, stating that speech has been abnormal since yesterday but thought it would just get better.  LKN was early Thursday when she went to bed at 1 AM. She woke up at 8 AM on Thursday and at that time noticed that her voice was different while speaking with her husband. She went through her day and felt like she could not quite get her speech back, so she decided to come in to the ED on Friday. She recently had been getting over a cold. CT head at [facility] was unremarkable. MRI brain revealed a left lentiform nucleus and corona radiata acute ischemic infarction, measuring up to 11 mm. ASA and Plavix  were administered in the ED. BP was elevated, with readings of 203/97 and 192/100. She was transferred to [facility] for stroke work up.   She denies any limb numbness, limb weakness or slowed limb movements. Does not endorse any vision changes or ataxia. She has no prior history of stroke. She is not on any blood thinners or any antiplatelet medications at home.   ROS  Comprehensive ROS performed and pertinent positives documented in HPI   Past History  As per HPI.   History reviewed. No pertinent surgical history.  Family History: History reviewed. No pertinent family history.  Social History  reports that she has never smoked. She has never used smokeless tobacco. She reports that she does not drink alcohol  and does not use drugs.  Allergies  Allergen Reactions   Antihistamines, Chlorpheniramine-Type Palpitations   Latex Dermatitis   Pseudoephedrine Hcl Palpitations    Medications   Current Facility-Administered Medications:    [START ON  06/10/2024]  stroke: early stages of recovery book, , Does not apply, Once, Debby Camila LABOR, MD   0.9 %  sodium chloride  infusion, , Intravenous, Continuous, Debby Camila LABOR, MD, Last Rate: 40 mL/hr at 06/09/24 0242, New Bag at 06/09/24 0242   acetaminophen  (TYLENOL ) tablet 650 mg, 650 mg, Oral, Q4H PRN **OR** acetaminophen  (TYLENOL ) 160 MG/5ML solution 650 mg, 650 mg, Per Tube, Q4H PRN **OR** acetaminophen  (TYLENOL ) suppository 650 mg, 650 mg, Rectal, Q4H PRN, Debby Camila LABOR, MD   alum & mag hydroxide-simeth (MAALOX/MYLANTA) 200-200-20 MG/5ML suspension 30 mL, 30 mL, Oral, Q4H PRN, Fairy Frames, MD   artificial tears ophthalmic solution 1 drop, 1 drop, Both Eyes, PRN, Fairy Frames, MD   guaiFENesin -dextromethorphan (ROBITUSSIN DM) 100-10 MG/5ML syrup 5 mL, 5 mL, Oral, Q4H PRN, Fairy Frames, MD   heparin  injection 5,000 Units, 5,000 Units, Subcutaneous, Q8H, Debby Camila A, MD, 5,000 Units at 06/09/24 9387   insulin  aspart (novoLOG ) injection 0-9 Units, 0-9 Units, Subcutaneous, TID WC, Singh, Prashant K, MD   lip balm (CARMEX) ointment 1 Application, 1 Application, Topical, PRN, Fairy Frames, MD   loratadine  (CLARITIN ) tablet 10 mg, 10 mg, Oral, Daily PRN, Joseph, Preetha, MD   phenol (CHLORASEPTIC) mouth spray 1 spray, 1 spray, Mouth/Throat, PRN, Fairy Frames, MD   rosuvastatin  (CRESTOR ) tablet 20 mg, 20 mg, Oral, Daily, Singh, Prashant K, MD, 20 mg at 06/09/24 0612   senna-docusate (Senokot-S) tablet 1 tablet, 1 tablet, Oral, QHS PRN, Debby Camila LABOR, MD  sodium chloride  (OCEAN) 0.65 % nasal spray 1 spray, 1 spray, Each Nare, PRN, Fairy Frames, MD   sodium chloride  flush (NS) 0.9 % injection 3 mL, 3 mL, Intravenous, Once, Yolande Lamar BROCKS, MD  No current facility-administered medications on file prior to encounter.   Current Outpatient Medications on File Prior to Encounter  Medication Sig Dispense Refill   glipiZIDE (GLUCOTROL XL) 10 MG 24 hr tablet  Take 10 mg by mouth daily with breakfast.     sitaGLIPtin -metformin  (JANUMET ) 50-500 MG tablet Take 2 tablets by mouth daily.       Vitals   Vitals:   25-Jun-2024 2226 2024/06/25 2326 06/09/24 0000 06/09/24 0400  BP: (!) 185/69 (!) 158/71 (!) 152/65 (!) 149/62  Pulse: 99 (!) 104 86 89  Resp: 16 17 16 16   Temp: 97.6 F (36.4 C) 98.7 F (37.1 C)  98.4 F (36.9 C)  TempSrc: Temporal Oral  Oral  SpO2: 96% 99% 96% 95%  Weight:      Height:        Body mass index is 37.01 kg/m.   Physical Exam   Constitutional: Appears well-developed and well-nourished.  Psych: Affect appropriate to situation.  Eyes: No scleral injection.  HENT: No OP obstruction.  Head: Normocephalic.  Respiratory: Effort normal, non-labored breathing.    Neurologic Examination   Mental Status: Alert, oriented x 5, thought content appropriate.  Speech fluent without evidence of a receptive or expressive aphasia. Significant dysarthria noted. Able to follow all commands without difficulty. Cranial Nerves: II: Temporal visual fields intact with no extinction to DSS. PERRL  III,IV, VI: No ptosis. EOMI. No nystagmus.  V: Temp sensation equal bilaterally  VII: Smile symmetric VIII: Hearing intact to voice IX,X: No hoarseness XI: Symmetric shoulder shrug XII: Midline tongue extension Motor: BUE 5/5 proximally and distally BLE 5/5 proximally and distally  No pronator drift.  Sensory: Temp and light touch intact throughout, bilaterally. No extinction to DSS.  Deep Tendon Reflexes: 2+ and symmetric throughout. Toes downgoing.  Cerebellar: No ataxia with FNF bilaterally  Gait: Deferred    Labs/Imaging/Neurodiagnostic studies   CBC:  Recent Labs  Lab 2024/06/25 1451 06/09/24 0353  WBC 11.0* 12.0*  NEUTROABS 7.1  --   HGB 12.2 11.2*  HCT 35.4* 33.5*  MCV 87.2 87.2  PLT 379 357   Basic Metabolic Panel:  Lab Results  Component Value Date   NA 134 (L) 06/25/2024   K 4.4 06/25/24   CO2 19 (L)  June 25, 2024   GLUCOSE 232 (H) Jun 25, 2024   BUN 14 25-Jun-2024   CREATININE 0.97 06/09/2024   CALCIUM  9.9 06-25-2024   GFRNONAA >60 06/09/2024   Lipid Panel:  Lab Results  Component Value Date   LDLCALC 82 06/09/2024   HgbA1c:  Lab Results  Component Value Date   HGBA1C 8.0 (H) 06/09/2024   Urine Drug Screen: No results found for: LABOPIA, COCAINSCRNUR, LABBENZ, AMPHETMU, THCU, LABBARB  Alcohol  Level     Component Value Date/Time   ETH <15 June 25, 2024 1451   INR  Lab Results  Component Value Date   INR 1.0 June 25, 2024   APTT  Lab Results  Component Value Date   APTT 25 06-25-2024     ASSESSMENT  78 y.o. female with a PMHx of DM, HTN and HLD who presented to the [facility] ED on Friday afternoon with slurred speech, stating that speech has been abnormal since yesterday but thought it would just get better.  LKN was early Thursday when she went to bed  at 1 AM. She woke up at 8 AM on Thursday and at that time noticed that her voice was different while speaking with her husband. She went through her day and felt like she could not quite get her speech back, so she decided to come in to the ED on Friday. She recently had been getting over a cold. CT head at [facility] was unremarkable. MRI brain revealed a left lentiform nucleus and corona radiata acute ischemic infarction, measuring up to 11 mm. ASA and Plavix  were administered in the ED. BP was elevated, with readings of 203/97 and 192/100. She was transferred to [facility] for stroke work up.  - Exam reveals dysarthria and right facial droop.  - CT head: No acute intracranial abnormality. Mild mucosal thickening in the left maxillary sinus and ethmoid air cells. - CTA of head and neck: No large vessel occlusion. Irregularity and moderate narrowing of a proximal M2 branch of the left MCA. Moderate-to-severe long segment narrowing of the right ACA from the distal A2 segment to the proximal A4 segment. Irregularity and moderate narrowing of  the anterior P2 segment of the left PCA. Atherosclerosis at the right carotid bifurcation and proximal right cervical ICA resulting in approximately 60% stenosis. Mild atherosclerosis at the left carotid bifurcation and nearly circumferential atherosclerotic plaque along the proximal left cervical ICA resulting in approximately 50% stenosis. 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 - MRI brain: Acute nonhemorrhagic infarct in the left lentiform nucleus and corona radiata, measuring up to 11 mm. Mildly advanced periventricular and subcortical T2 hyperintensities for age, worse on the left. More remote lacunar infarcts in the left corona radiata. - Labs: Elevated triglycerides. WBC 12.0. HgbA1c 8.8. Glucose 232.  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 Has significant atherosclerosis on CTA as well.   RECOMMENDATIONS  - Carotid ultrasound to further characterize the cervical ICA bilateral narrowing seen on CTA.  - TTE - Cardiac telemetry - PT consult, OT consult, Speech consult - Continue DAPT with ASA and Plavix  - Statin - Glycemic control - BP management. Out of the permissive HTN time window.   - Risk factor modification - Frequent neuro checks - NPO until passes stroke swallow screen - Stroke Team to follow in the morning  I personally spent a total of 50 minutes in the care of the patient today including performing a medically appropriate exam/evaluation, documenting clinical information in the EHR, and independently interpreting results.  ______________________________________________________________________    Bonney SHARK, Irish Breisch, MD [facility]

## 2024-06-09 NOTE — Evaluation (Addendum)
 Physical Therapy Evaluation  Patient Details Name: Hailey Johnson MRN: [id number] DOB: [birth year] Today's Date: 06/09/2024  History of Present Illness  Pt is a 78 y/o female who presents 06/08/2024 with slurred speech x24 hours. 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 PMH significant for HTN, obesity, DM II.   Clinical Impression  Pt admitted with above diagnosis. Pt currently with functional limitations due to the deficits listed below (see PT Problem List). At the time of PT eval pt was able to perform transfers with supervision for safety and ambulation with min assist to CGA with RW for support. Pt typically does not use a RW at baseline. Recommend >3 hours/day follow up therapies at d/c to maximize functional independence, safety, and facilitate return to PLOF. Pt has the potential to progress quickly from a PT standpoint. Pt will benefit from acute skilled PT to increase their independence and safety with mobility to allow discharge.           If plan is discharge home, recommend the following: A little help with walking and/or transfers;A little help with bathing/dressing/bathroom;Assistance with cooking/housework;Assist for transportation;Help with stairs or ramp for entrance   Can travel by private vehicle        Equipment Recommendations Rolling walker (2 wheels)  Recommendations for Other Services       Functional Status Assessment Patient has had a recent decline in their functional status and demonstrates the ability to make significant improvements in function in a reasonable and predictable amount of time.     Precautions / Restrictions Precautions Precautions: Fall Recall of Precautions/Restrictions: Impaired Restrictions Weight Bearing Restrictions Per Provider Order: No      Mobility  Bed Mobility Overal bed mobility: Needs Assistance Bed Mobility: Supine to Sit     Supine to sit:  Supervision     General bed mobility comments: No assist required to transition around and get feet on the floor. Pt required increased time and min use of rails.    Transfers Overall transfer level: Needs assistance Equipment used: None Transfers: Sit to/from Stand Sit to Stand: Supervision           General transfer comment: Pt able to stand EOB without assist. She stood quickly to adjust gown and sat back down without difficulty. When returning to EOB after ambulation, pt rushing to sit and noted decreased safety awareness with abandoning RW and paying little attention to lines.    Ambulation/Gait Ambulation/Gait assistance: Contact guard assist, Min assist Gait Distance (Feet): 150 Feet Assistive device: Rolling walker (2 wheels), None Gait Pattern/deviations: Step-through pattern, Decreased stride length, Trunk flexed Gait velocity: Decreased Gait velocity interpretation: <1.31 ft/sec, indicative of household ambulator   General Gait Details: Pt initially ambulating without UE support and pt demonstrating a short, shuffling gait pattern with  uncoordinated landing at beginning of gait cycle. With RW, pt appears smoother and is able to demonstrate more of a heel strike. Noted RR up to 44 breaths per minute during gait training. Recovered to ~14 by end of session.  Stairs            Wheelchair Mobility     Tilt Bed    Modified Rankin (Stroke Patients Only)       Balance Overall balance assessment: Needs assistance Sitting-balance support: Feet supported, No upper extremity supported Sitting balance-Leahy Scale: Good     Standing balance support: No upper extremity supported Standing balance-Leahy Scale: Fair  Pertinent Vitals/Pain Pain Assessment Pain Assessment: No/denies pain    Home Living Family/patient expects to be discharged to:: Private residence Living Arrangements: Spouse/significant other Available  Help at Discharge: Friend(s);Family;Available PRN/intermittently (husband able to physically assist however does not drive; daughter and son can assist PRN) Type of Home: House Home Access: Stairs to enter (usually go in from garage) Entrance Stairs-Rails: Left (both on the 6 back stairs) Entrance Stairs-Number of Steps: 2+10 (from garage; 6+2 at back deck;) Alternate Level Stairs-Number of Steps: flight Home Layout: Multi-level;Able to live on main level with bedroom/bathroom (garage in basement and parking area behind house) Home Equipment: Shower seat - built in;Grab bars - tub/shower;Hand held shower head Additional Comments: husband does not drive    Prior Function Prior Level of Function : Independent/Modified Independent             Mobility Comments: Ind ADLs Comments: Ind; babysits 1 day a week for grandkids; quilts     Extremity/Trunk Assessment   Upper Extremity Assessment Upper Extremity Assessment: Defer to OT evaluation RUE Deficits / Details: strength and ROM WFL; gross motor coordination largely WFL; decreased proprioception; decreased fine motor coordination RUE Sensation: decreased proprioception RUE Coordination: decreased fine motor    Lower Extremity Assessment Lower Extremity Assessment: Generalized weakness (equal R and L, however with some coordination deficits R>L.)    Cervical / Trunk Assessment Cervical / Trunk Assessment: Normal  Communication   Communication Communication: Impaired Factors Affecting Communication: Reduced clarity of speech    Cognition Arousal: Alert Behavior During Therapy: Flat affect   PT - Cognitive impairments: No apparent impairments                         Following commands: Intact       Cueing Cueing Techniques: Verbal cues, Gestural cues     General Comments      Exercises     Assessment/Plan    PT Assessment Patient needs continued PT services  PT Problem List Decreased strength;Decreased  range of motion;Decreased activity tolerance;Decreased balance;Decreased mobility;Decreased knowledge of use of DME;Decreased safety awareness;Decreased knowledge of precautions       PT Treatment Interventions DME instruction;Gait training;Stair training;Therapeutic activities;Functional mobility training;Therapeutic exercise;Neuromuscular re-education;Balance training;Cognitive remediation;Patient/family education    PT Goals (Current goals can be found in the Care Plan section)  Acute Rehab PT Goals Patient Stated Goal: home at d/c, be able to drive PT Goal Formulation: With patient Time For Goal Achievement: 06/16/24 Potential to Achieve Goals: Good    Frequency Min 3X/week     Co-evaluation               AM-PAC PT 6 Clicks Mobility  Outcome Measure Help needed turning from your back to your side while in a flat bed without using bedrails?: A Little Help needed moving from lying on your back to sitting on the side of a flat bed without using bedrails?: A Little Help needed moving to and from a bed to a chair (including a wheelchair)?: A Little Help needed standing up from a chair using your arms (e.g., wheelchair or bedside chair)?: A Little Help needed to walk in hospital room?: A Little Help needed climbing 3-5 steps with a railing? : A Lot 6 Click Score: 17    End of Session Equipment Utilized During Treatment: Gait belt Activity Tolerance: Patient limited by fatigue Patient left: in bed;with call bell/phone within reach;with bed alarm set;with family/visitor present Nurse Communication: Mobility status PT Visit  Diagnosis: Unsteadiness on feet (R26.81);Other symptoms and signs involving the nervous system (R29.898)    Time: 1234-1300 PT Time Calculation (min) (ACUTE ONLY): 26 min   Charges:   PT Evaluation $PT Eval Moderate Complexity: 1 Mod PT Treatments $Gait Training: 8-22 mins PT General Charges $$ ACUTE PT VISIT: 1 Visit         Leita Sable,  PT, DPT Acute Rehabilitation Services Secure Chat Preferred Office: [phone]   Leita JONETTA Sable 06/09/2024, 2:22 PM

## 2024-06-09 NOTE — Progress Notes (Signed)
 PROGRESS NOTE                                                                                                                                                                                                             Patient Demographics:    Hailey Johnson, is a 78 y.o. female, DOB - 1946-07-30, FMW:[id number]  Outpatient Primary MD for the patient is Dyane Anthony RAMAN, FNP    LOS - 1  Admit date - 06/08/2024    Chief Complaint  Patient presents with   Aphasia       Brief Narrative (HPI from H&P)    78 y.o. female with medical history significant of hypertension, obesity DMII,,and hyperlipidemia Who presents to ED with complaint of slurred speech x 24 hours. Patient noted the last time her speech was note to be normal was going to bed on 2 nights ago.  Patient notes no HA, vision changes, difficulty swallowing, focal weakness or paresthesias.  Workup confirmed acute stroke.   Subjective:    Hailey Johnson today has, No headache, No chest pain, No abdominal pain - No Nausea, No new weakness tingling or numbness, no SOB, will have some right-sided facial weakness and difficulty in her speech.   Assessment  & Plan :     CVA - Acute nonhemorrhagic infarct in the left lentiform nucleus and corona radiata, measuring up to 11 mm, full stroke workup being done, minimal right-sided facial droop, dysarthria, on DAPT, LDL was above goal hence statin added, A1c high as well, diabetic education.  Echocardiogram and carotid ultrasound pending.  Further recommendations per stroke team.  Hypertension -permissive HTN, -hydralazine  prn   HLD -placed on Crestor    Mild leukocytosis  - Secondary monitor.  DMII -iss/fs, provide diabetic and insulin  education.   Lab Results  Component Value Date   HGBA1C 8.0 (H) 06/09/2024   CBG (last 3)  Recent Labs    06/08/24 1444 06/09/24 0751  GLUCAP 212* 234*   Lab Results  Component Value Date    CHOL 166 06/09/2024   HDL 49 06/09/2024   LDLCALC 82 06/09/2024   TRIG 173 (H) 06/09/2024   CHOLHDL 3.4 06/09/2024          Condition - Extremely Guarded  Family Communication  : Husband Toribio [phone]  on 06/09/2024 at 9 AM message left  Code  Status : Full code  Consults  : Neurology  PUD Prophylaxis :     Procedures  :     Echocardiogram.    MRI.  1. Acute nonhemorrhagic infarct in the left lentiform nucleus and corona radiata, measuring up to 11 mm. 2. Mildly advanced periventricular and subcortical T2 hyperintensities for age, worse on the left. 3. More remote lacunar infarcts in the left corona radiata.  CTA head and neck - 1. No large vessel occlusion. 2. Irregularity and moderate narrowing of a proximal M2 branch of the left MCA. 3. Moderate-to-severe long segment narrowing of the right ACA from the distal A2 segment to the proximal A4 segment. 4. Irregularity and moderate narrowing of the anterior P2 segment of the left PCA. 5. Atherosclerosis at the right carotid bifurcation and proximal right cervical ICA resulting in approximately 60% stenosis. 6. Mild atherosclerosis at the left carotid bifurcation and nearly circumferential atherosclerotic plaque along the proximal left cervical ICA resulting in approximately 50% stenosis. 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 Yes; Fluid consistency: Thin  Diet effective now                    Inpatient Medications  Scheduled Meds:  [START ON 06/10/2024]  stroke: early stages of recovery book   Does not apply Once   aspirin   81 mg Oral Daily   clopidogrel   75 mg Oral Daily   heparin   5,000 Units Subcutaneous Q8H    insulin  aspart  0-9 Units Subcutaneous TID WC   rosuvastatin   20 mg Oral Daily   sodium chloride  flush  3 mL Intravenous Once   Continuous Infusions:  sodium chloride  40 mL/hr at 06/09/24 0242   PRN Meds:.acetaminophen  **OR** acetaminophen  (TYLENOL ) oral liquid 160 mg/5 mL **OR** acetaminophen , alum & mag hydroxide-simeth, artificial tears, guaiFENesin -dextromethorphan, hydrALAZINE , lip balm, loratadine , phenol, senna-docusate, sodium chloride   Antibiotics  :    Anti-infectives (From admission, onward)    None         Objective:   Vitals:   06/08/24 2326 06/09/24 0000 06/09/24 0400 06/09/24 0748  BP: (!) 158/71 (!) 152/65 (!) 149/62 (!) 158/83  Pulse: (!) 104 86 89   Resp: 17 16 16    Temp: 98.7 F (37.1 C)  98.4 F (36.9 C) 97.9 F (36.6 C)  TempSrc: Oral  Oral Oral  SpO2: 99% 96% 95% 98%  Weight:      Height:        Wt Readings from Last 3 Encounters:  06/08/24 97.8 kg     Intake/Output Summary (Last 24 hours) at 06/09/2024 0853 Last data filed at 06/09/2024 0242 Gross per 24 hour  Intake 0 ml  Output --  Net 0 ml     Physical Exam  Awake Alert, No new F.N deficits, mild right-sided facial droop and dysarthria .AT,PERRAL Supple Neck, No JVD,   Symmetrical Chest wall movement, Good air movement bilaterally, CTAB RRR,No Gallops,Rubs or new Murmurs,  +ve B.Sounds, Abd Soft, No tenderness,   No Cyanosis, Clubbing or edema     RN pressure  injury documentation:      Data Review:    Recent Labs  Lab 06/08/24 1451 06/09/24 0353  WBC 11.0* 12.0*  HGB 12.2 11.2*  HCT 35.4* 33.5*  PLT 379 357  MCV 87.2 87.2  MCH 30.0 29.2  MCHC 34.5 33.4  RDW 12.6 12.5  LYMPHSABS 2.6  --   MONOABS 0.7  --   EOSABS 0.5  --   BASOSABS 0.1  --     Recent Labs  Lab 06/08/24 1451 06/09/24 0353  NA 134*  --   K 4.4  --   CL 98  --   CO2 19*  --   ANIONGAP 17*  --   GLUCOSE 232*  --   BUN 14  --   CREATININE 0.92 0.97  AST 16  --   ALT 13  --   ALKPHOS  115  --   BILITOT 0.3  --   ALBUMIN 4.1  --   INR 1.0  --   HGBA1C  --  8.0*  CALCIUM  9.9  --       Recent Labs  Lab 06/08/24 1451 06/09/24 0353  INR 1.0  --   HGBA1C  --  8.0*  CALCIUM  9.9  --     --------------------------------------------------------------------------------------------------------------- Lab Results  Component Value Date   CHOL 166 06/09/2024   HDL 49 06/09/2024   LDLCALC 82 06/09/2024   TRIG 173 (H) 06/09/2024   CHOLHDL 3.4 06/09/2024    Lab Results  Component Value Date   HGBA1C 8.0 (H) 06/09/2024   No results for input(s): TSH, T4TOTAL, FREET4, T3FREE, THYROIDAB in the last 72 hours. No results for input(s): VITAMINB12, FOLATE, FERRITIN, TIBC, IRON, RETICCTPCT in the last 72 hours. ------------------------------------------------------------------------------------------------------------------ Cardiac Enzymes No results for input(s): CKMB, TROPONINI, MYOGLOBIN in the last 168 hours.  Invalid input(s): CK  Micro Results Recent Results (from the past 240 hours)  Resp panel by RT-PCR (RSV, Flu A&B, Covid) Anterior Nasal Swab     Status: None   Collection Time: 06/08/24  4:07 PM   Specimen: Anterior Nasal Swab  Result Value Ref Range Status   SARS Coronavirus 2 by RT PCR NEGATIVE NEGATIVE Final    Comment: (NOTE) SARS-CoV-2 target nucleic acids are NOT DETECTED.  The SARS-CoV-2 RNA is generally detectable in upper respiratory specimens during the acute phase of infection. The lowest concentration of SARS-CoV-2 viral copies this assay can detect is 138 copies/mL. A negative result does not preclude SARS-Cov-2 infection and should not be used as the sole basis for treatment or other patient management decisions. A negative result may occur with  improper specimen collection/handling, submission of specimen other than nasopharyngeal swab, presence of viral mutation(s) within the areas targeted by this assay, and  inadequate number of viral copies(<138 copies/mL). A negative result must be combined with clinical observations, patient history, and epidemiological information. The expected result is Negative.  Fact Sheet for Patients:  BloggerCourse.com  Fact Sheet for Healthcare Providers:  SeriousBroker.it  This test is no t yet approved or cleared by the [place]  FDA and  has been authorized for detection and/or diagnosis of SARS-CoV-2 by FDA under an Emergency Use Authorization (EUA). This EUA will remain  in effect (meaning this test can be used) for the duration of the COVID-19 declaration under Section 564(b)(1) of the Act, 21 U.S.C.section 360bbb-3(b)(1), unless the authorization is terminated  or revoked sooner.       Influenza A by PCR NEGATIVE NEGATIVE Final   Influenza B by  PCR NEGATIVE NEGATIVE Final    Comment: (NOTE) The Xpert Xpress SARS-CoV-2/FLU/RSV plus assay is intended as an aid in the diagnosis of influenza from Nasopharyngeal swab specimens and should not be used as a sole basis for treatment. Nasal washings and aspirates are unacceptable for Xpert Xpress SARS-CoV-2/FLU/RSV testing.  Fact Sheet for Patients: BloggerCourse.com  Fact Sheet for Healthcare Providers: SeriousBroker.it  This test is not yet approved or cleared by the [place]  FDA and has been authorized for detection and/or diagnosis of SARS-CoV-2 by FDA under an Emergency Use Authorization (EUA). This EUA will remain in effect (meaning this test can be used) for the duration of the COVID-19 declaration under Section 564(b)(1) of the Act, 21 U.S.C. section 360bbb-3(b)(1), unless the authorization is terminated or revoked.     Resp Syncytial Virus by PCR NEGATIVE NEGATIVE Final    Comment: (NOTE) Fact Sheet for Patients: BloggerCourse.com  Fact Sheet for Healthcare  Providers: SeriousBroker.it  This test is not yet approved or cleared by the [place]  FDA and has been authorized for detection and/or diagnosis of SARS-CoV-2 by FDA under an Emergency Use Authorization (EUA). This EUA will remain in effect (meaning this test can be used) for the duration of the COVID-19 declaration under Section 564(b)(1) of the Act, 21 U.S.C. section 360bbb-3(b)(1), unless the authorization is terminated or revoked.  Performed at [facility], [address] [place], [place] [address]     Radiology Report CT ANGIO HEAD NECK W WO CM Result Date: 06/08/2024 EXAM: CT HEAD WITHOUT CTA HEAD AND NECK WITH AND WITHOUT 06/08/2024 05:05:43 PM TECHNIQUE: CTA of the head and neck was performed with and without the administration of intravenous contrast. Noncontrast CT of the head with reconstructed 2-D images are also provided for review. Multiplanar 2D and/or 3D reformatted images are provided for review. Automated exposure control, iterative reconstruction, and/or weight based adjustment of the mA/kV was utilized to reduce the radiation dose to as low as reasonably achievable. COMPARISON: Same day CT head and MRI head CLINICAL HISTORY: Stroke dysarthria. FINDINGS: CT HEAD: BRAIN AND VENTRICLES: No acute intracranial hemorrhage. No mass effect. No midline shift. No extra-axial fluid collection. No evidence of acute infarct. No hydrocephalus. ORBITS: No acute abnormality. SINUSES AND MASTOIDS: No acute abnormality. CTA NECK: AORTIC ARCH AND ARCH VESSELS: Mild atherosclerosis of the aortic arch. CERVICAL CAROTID ARTERIES: The right carotid artery is patent from the origin to the skull base. Atherosclerosis of the distal right common carotid artery resulting in mild stenosis. Additional atherosclerosis at the carotid bifurcation and proximal right cervical ICA resulting in approximately 60% stenosis of the proximal right cervical ICA. The  left carotid artery is patent from the origin to the skull base. Mild atherosclerosis at the carotid bifurcation. There is additional nearly circumferential atherosclerotic plaque along the proximal left cervical ICA. There is approximately 50% stenosis of the proximal cervical ICA just proximal to this point of maximal narrowing. 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 The vessels course through the skull base through separate canals and then join at the posterior genu of the petrous ICA. CERVICAL VERTEBRAL ARTERIES: The vertebral arteries are patent from the origins to the vertebrobasilar confluence. Tortuosity of the left V1 segment. Atherosclerosis of the left V4 segment resulting in mild stenosis. LUNGS AND MEDIASTINUM: Unremarkable. SOFT TISSUES: No acute abnormality. BONES: Degenerative changes in the visualized spine. Prominent disc osteophyte complex is most pronounced at C5-6, resulting in at least moderate spinal canal stenosis. CTA  HEAD: ANTERIOR CIRCULATION: The intracranial internal carotid arteries are patent bilaterally. Mild atherosclerosis of the carotid siphons without hemodynamically significant stenosis. There is irregularity and moderate narrowing of a proximal M2 branch of the left MCA. The middle cerebral arteries are patent bilaterally. Hypoplastic right A1 segment. The anterior cerebral arteries are patent. There is moderate-to-severe long segment narrowing of the right ACA from the distal A2 segment to the proximal A4 segment. POSTERIOR CIRCULATION: The posterior cerebral arteries are patent bilaterally, with irregularity and moderate narrowing of the anterior P2 segment of the left PCA. OTHER: No dural venous sinus thrombosis on this non-dedicated study. IMPRESSION: 1. No large vessel occlusion. 2. Irregularity and moderate narrowing of a proximal M2 branch of the left MCA. 3. Moderate-to-severe long segment narrowing of the right ACA from the  distal A2 segment to the proximal A4 segment. 4. Irregularity and moderate narrowing of the anterior P2 segment of the left PCA. 5. Atherosclerosis at the right carotid bifurcation and proximal right cervical ICA resulting in approximately 60% stenosis. 6. Mild atherosclerosis at the left carotid bifurcation and nearly circumferential atherosclerotic plaque along the proximal left cervical ICA resulting in approximately 50% stenosis. 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 Electronically signed by: Donnice Mania MD 06/08/2024 05:45 PM EDT RP Workstation: [id number]   DG Chest 2 View Result Date: 06/08/2024 CLINICAL DATA:  cough EXAM: CHEST - 2 VIEW COMPARISON:  September 25, 2009 FINDINGS: No focal airspace consolidation, pleural effusion, or pneumothorax. No cardiomegaly. Tortuous aorta with aortic atherosclerosis. No acute fracture or destructive lesions. Multilevel thoracic osteophytosis. Bilateral AC joint osteoarthritis. IMPRESSION: No acute cardiopulmonary abnormality. Electronically Signed   By: Rogelia Myers M.D.   On: 06/08/2024 16:26   MR BRAIN WO CONTRAST Result Date: 06/08/2024 EXAM: MRI BRAIN WITHOUT CONTRAST 06/08/2024 03:58:53 PM TECHNIQUE: Multiplanar multisequence MRI of the head/brain was performed without the administration of intravenous contrast. COMPARISON: None available. CLINICAL HISTORY: Dysarthria, right facial droop. Patient arrived POV caox4 complaining of slurred speech stating her speech has been abnormal since yesterday but thought it would just get better. Last known well Wednesday night. Denies numbness/weakness in extremities. Denies any recent trauma to the head. FINDINGS: BRAIN AND VENTRICLES: Acute nonhemorrhagic infarct in the left lentiform nucleus and corona radiata measures up to 11 mm on the coronal diffusion images. T2 and FLAIR signal is associated. Periventricular and subcortical T2 hyperintensities are mildly advanced for age, worse  on the left. More remote lacunar infarcts are present in the left corona radiata. No intracranial hemorrhage. No mass. No midline shift. No hydrocephalus. The sella is unremarkable. Normal flow voids. ORBITS: No acute abnormality. SINUSES AND MASTOIDS: Mild mucosal thickening is present in the left maxillary sinus and ethmoid air cells. Small mastoid effusions are present. BONES AND SOFT TISSUES: Normal marrow signal. No acute soft tissue abnormality. IMPRESSION: 1. Acute nonhemorrhagic infarct in the left lentiform nucleus and corona radiata, measuring up to 11 mm. 2. Mildly advanced periventricular and subcortical T2 hyperintensities for age, worse on the left. 3. More remote lacunar infarcts in the left corona radiata. Cortical values were called to Dr. Yolande at 4:25 pm. Electronically signed by: Lonni Necessary MD 06/08/2024 04:26 PM EDT RP Workstation: [id number]   CT HEAD WO CONTRAST Result Date: 06/08/2024 EXAM: CT HEAD WITHOUT CONTRAST 06/08/2024 03:15:00 PM TECHNIQUE: CT of the head was performed without the administration of intravenous contrast. Automated exposure control, iterative reconstruction, and/or weight based adjustment of the mA/kV was utilized to reduce the  radiation dose to as low as reasonably achievable. COMPARISON: None available. CLINICAL HISTORY: Neuro deficit, acute, stroke suspected. Pt states been sick (cough/running nose), the slurred speech seemed to start yesterday at some point-pt could not say. Noted right sided facial droop and drooling-patient willing/able to stand and said she did not feel any different but is unable to talk the same. FINDINGS: BRAIN AND VENTRICLES: No acute hemorrhage. No evidence of acute infarct. No hydrocephalus. No extra-axial collection. No mass effect or midline shift. Mild atrophy is within normal limits for age. ORBITS: No acute abnormality. SINUSES: Mild mucosal thickening is present within the left maxillary sinus and ethmoid air cells.  SOFT TISSUES AND SKULL: No acute soft tissue abnormality. No skull fracture. IMPRESSION: 1. No acute intracranial abnormality. 2. Mild mucosal thickening in the left maxillary sinus and ethmoid air cells. Electronically signed by: Lonni Necessary MD 06/08/2024 03:25 PM EDT RP Workstation: [id number]     Signature  -   Lavada Stank M.D on 06/09/2024 at 8:53 AM   -  To page go to [email or web address]

## 2024-06-09 NOTE — Progress Notes (Addendum)
 STROKE TEAM PROGRESS NOTE   INTERIM HISTORY/SUBJECTIVE Patient is resting comfortably in bed with a friend at her bedside.  We discussed she had a stroke, and we are continuing workup to determine the source of her stroke.  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  She also believes recent significant stress could play a role in her symptoms. No other concerns at this time.  OBJECTIVE  CBC    Component Value Date/Time   WBC 12.0 (H) 06/09/2024 0353   RBC 3.84 (L) 06/09/2024 0353   HGB 11.2 (L) 06/09/2024 0353   HCT 33.5 (L) 06/09/2024 0353   PLT 357 06/09/2024 0353   MCV 87.2 06/09/2024 0353   MCH 29.2 06/09/2024 0353   MCHC 33.4 06/09/2024 0353   RDW 12.5 06/09/2024 0353   LYMPHSABS 2.6 06/08/2024 1451   MONOABS 0.7 06/08/2024 1451   EOSABS 0.5 06/08/2024 1451   BASOSABS 0.1 06/08/2024 1451    BMET    Component Value Date/Time   NA 134 (L) 06/08/2024 1451   K 4.4 06/08/2024 1451   CL 98 06/08/2024 1451   CO2 19 (L) 06/08/2024 1451   GLUCOSE 232 (H) 06/08/2024 1451   BUN 14 06/08/2024 1451   CREATININE 0.97 06/09/2024 0353   CALCIUM  9.9 06/08/2024 1451   GFRNONAA >60 06/09/2024 0353    IMAGING past 24 hours CT ANGIO HEAD NECK W WO CM Result Date: 06/08/2024 EXAM: CT HEAD WITHOUT CTA HEAD AND NECK WITH AND WITHOUT 06/08/2024 05:05:43 PM TECHNIQUE: CTA of the head and neck was performed with and without the administration of intravenous contrast. Noncontrast CT of the head with reconstructed 2-D images are also provided for review. Multiplanar 2D and/or 3D reformatted images are provided for review. Automated exposure control, iterative reconstruction, and/or weight based adjustment of the mA/kV was utilized to reduce the radiation dose to as low as reasonably achievable. COMPARISON: Same day CT head and MRI head CLINICAL HISTORY: Stroke dysarthria.  FINDINGS: CT HEAD: BRAIN AND VENTRICLES: No acute intracranial hemorrhage. No mass effect. No midline shift. No extra-axial fluid collection. No evidence of acute infarct. No hydrocephalus. ORBITS: No acute abnormality. SINUSES AND MASTOIDS: No acute abnormality. CTA NECK: AORTIC ARCH AND ARCH VESSELS: Mild atherosclerosis of the aortic arch. CERVICAL CAROTID ARTERIES: The right carotid artery is patent from the origin to the skull base. Atherosclerosis of the distal right common carotid artery resulting in mild stenosis. Additional atherosclerosis at the carotid bifurcation and proximal right cervical ICA resulting in approximately 60% stenosis of the proximal right cervical ICA. The left carotid artery is patent from the origin to the skull base. Mild atherosclerosis at the carotid bifurcation. There is additional nearly circumferential atherosclerotic plaque along the proximal left cervical ICA. There is approximately 50% stenosis of the proximal cervical ICA just proximal to this point of maximal narrowing. 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 The vessels course through the skull base through separate canals and then join at the posterior genu of the petrous ICA. CERVICAL VERTEBRAL ARTERIES: The vertebral arteries are patent from the origins to the vertebrobasilar confluence. Tortuosity of the left V1 segment. Atherosclerosis of the left V4 segment resulting in mild stenosis. LUNGS AND MEDIASTINUM: Unremarkable. SOFT TISSUES: No acute abnormality. BONES: Degenerative changes in the visualized spine. Prominent disc osteophyte complex is most pronounced at C5-6, resulting in at least moderate spinal canal  stenosis. CTA HEAD: ANTERIOR CIRCULATION: The intracranial internal carotid arteries are patent bilaterally. Mild atherosclerosis of the carotid siphons without hemodynamically significant stenosis. There is irregularity and moderate narrowing of a proximal M2 branch of  the left MCA. The middle cerebral arteries are patent bilaterally. Hypoplastic right A1 segment. The anterior cerebral arteries are patent. There is moderate-to-severe long segment narrowing of the right ACA from the distal A2 segment to the proximal A4 segment. POSTERIOR CIRCULATION: The posterior cerebral arteries are patent bilaterally, with irregularity and moderate narrowing of the anterior P2 segment of the left PCA. OTHER: No dural venous sinus thrombosis on this non-dedicated study. IMPRESSION: 1. No large vessel occlusion. 2. Irregularity and moderate narrowing of a proximal M2 branch of the left MCA. 3. Moderate-to-severe long segment narrowing of the right ACA from the distal A2 segment to the proximal A4 segment. 4. Irregularity and moderate narrowing of the anterior P2 segment of the left PCA. 5. Atherosclerosis at the right carotid bifurcation and proximal right cervical ICA resulting in approximately 60% stenosis. 6. Mild atherosclerosis at the left carotid bifurcation and nearly circumferential atherosclerotic plaque along the proximal left cervical ICA resulting in approximately 50% stenosis. 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 Electronically signed by: Donnice Mania MD 06/08/2024 05:45 PM EDT RP Workstation: [id number]   DG Chest 2 View Result Date: 06/08/2024 CLINICAL DATA:  cough EXAM: CHEST - 2 VIEW COMPARISON:  September 25, 2009 FINDINGS: No focal airspace consolidation, pleural effusion, or pneumothorax. No cardiomegaly. Tortuous aorta with aortic atherosclerosis. No acute fracture or destructive lesions. Multilevel thoracic osteophytosis. Bilateral AC joint osteoarthritis. IMPRESSION: No acute cardiopulmonary abnormality. Electronically Signed   By: Rogelia Myers M.D.   On: 06/08/2024 16:26   MR BRAIN WO CONTRAST Result Date: 06/08/2024 EXAM: MRI BRAIN WITHOUT CONTRAST 06/08/2024 03:58:53 PM TECHNIQUE: Multiplanar multisequence MRI of the head/brain  was performed without the administration of intravenous contrast. COMPARISON: None available. CLINICAL HISTORY: Dysarthria, right facial droop. Patient arrived POV caox4 complaining of slurred speech stating her speech has been abnormal since yesterday but thought it would just get better. Last known well Wednesday night. Denies numbness/weakness in extremities. Denies any recent trauma to the head. FINDINGS: BRAIN AND VENTRICLES: Acute nonhemorrhagic infarct in the left lentiform nucleus and corona radiata measures up to 11 mm on the coronal diffusion images. T2 and FLAIR signal is associated. Periventricular and subcortical T2 hyperintensities are mildly advanced for age, worse on the left. More remote lacunar infarcts are present in the left corona radiata. No intracranial hemorrhage. No mass. No midline shift. No hydrocephalus. The sella is unremarkable. Normal flow voids. ORBITS: No acute abnormality. SINUSES AND MASTOIDS: Mild mucosal thickening is present in the left maxillary sinus and ethmoid air cells. Small mastoid effusions are present. BONES AND SOFT TISSUES: Normal marrow signal. No acute soft tissue abnormality. IMPRESSION: 1. Acute nonhemorrhagic infarct in the left lentiform nucleus and corona radiata, measuring up to 11 mm. 2. Mildly advanced periventricular and subcortical T2 hyperintensities for age, worse on the left. 3. More remote lacunar infarcts in the left corona radiata. Cortical values were called to Dr. Yolande at 4:25 pm. Electronically signed by: Lonni Necessary MD 06/08/2024 04:26 PM EDT RP Workstation: [id number]   CT HEAD WO CONTRAST Result Date: 06/08/2024 EXAM: CT HEAD WITHOUT CONTRAST 06/08/2024 03:15:00 PM TECHNIQUE: CT of the head was performed without the administration of intravenous contrast. Automated exposure control, iterative reconstruction, and/or weight based adjustment of the mA/kV was utilized to  reduce the radiation dose to as low as reasonably achievable.  COMPARISON: None available. CLINICAL HISTORY: Neuro deficit, acute, stroke suspected. Pt states been sick (cough/running nose), the slurred speech seemed to start yesterday at some point-pt could not say. Noted right sided facial droop and drooling-patient willing/able to stand and said she did not feel any different but is unable to talk the same. FINDINGS: BRAIN AND VENTRICLES: No acute hemorrhage. No evidence of acute infarct. No hydrocephalus. No extra-axial collection. No mass effect or midline shift. Mild atrophy is within normal limits for age. ORBITS: No acute abnormality. SINUSES: Mild mucosal thickening is present within the left maxillary sinus and ethmoid air cells. SOFT TISSUES AND SKULL: No acute soft tissue abnormality. No skull fracture. IMPRESSION: 1. No acute intracranial abnormality. 2. Mild mucosal thickening in the left maxillary sinus and ethmoid air cells. Electronically signed by: Lonni Necessary MD 06/08/2024 03:25 PM EDT RP Workstation: [id number]    Vitals:   06/08/24 2326 06/09/24 0000 06/09/24 0400 06/09/24 0748  BP: (!) 158/71 (!) 152/65 (!) 149/62 (!) 158/83  Pulse: (!) 104 86 89   Resp: 17 16 16    Temp: 98.7 F (37.1 C)  98.4 F (36.9 C) 97.9 F (36.6 C)  TempSrc: Oral  Oral Oral  SpO2: 99% 96% 95% 98%  Weight:      Height:       PHYSICAL EXAM General:  Alert, well-nourished, well-developed patient in no acute distress Psych: Stressed due to husband's recent strokes, car totaled, and citations pending CV: Regular rate and rhythm on monitor Respiratory:  Regular, unlabored respirations on room air  NEURO:  Mental Status: AA&Ox3, patient is able to give coherent history Speech/Language: speech with significant dysarthria but without aphasia.  Naming, repetition, fluency, and comprehension intact.  Cranial Nerves:  II: PERRL. Visual fields full.  III, IV, VI: EOMI. Eyelids elevate symmetrically.  V: Sensation is intact to light touch and symmetrical to  face.  VII: Mild right facial droop VIII: hearing intact to voice. IX, X: Palate elevates symmetrically. Phonation is normal.  KP:Dynloizm shrug 5/5. XII: tongue is midline without fasciculations. Motor: 5/5 strength LLE, RLE, LUE and 4+/5 RUE Tone: is normal and bulk is normal Sensation- Intact to light touch bilaterally. Extinction absent to light touch to DSS.   Coordination: FTN intact bilaterally, HKS: no ataxia in BLE. No drift.  Gait- deferred  Most Recent NIH 2   ASSESSMENT/PLAN  Ms. JAI BEAR is a 78 y.o. female with history of hypertension, hyperlipidemia, obesity, type 2 diabetes admitted for slurred speech was hypertensive on arrival with readings of 203/97 and 192/100.   Stroke: left CR infarct, etiology likely from large vessel disease from left MCA and ICA stenosis CT head with no acute abnormality. CTA head & neck showed no LVO, some irregularity and moderate narrowing of proximal M2 branch of left MCA.  Moderate to severe long segment narrowing of right ACA from distal A2 segment to proximal V4 segment.  Irregularity and moderate narrowing of anterior P2 segment of left PCA, atherosclerosis of the right carotid bifurcation and proximal right cervical ICA-approximately 60% stenosis.  Mild atherosclerosis at the left carotid bifurcation and nearly circumferential atherosclerotic plaque along the proximal left cervical ICA-50% stenosis. MRI brain showed acute nonhemorrhagic infarct along left lenticular form nucleus and corona radiata, mildly advanced periventricular and subcortical T2 hyperintensities, worse on the left.  Some remote lacunar infarcts in the left corona radiata. Carotid Doppler pending 2D Echo pending LDL 82 HgbA1c 8.0  VTE prophylaxis - heparin  subcu No antithrombotic prior to admission, now on aspirin  81 mg daily and clopidogrel  75 mg daily.  Final regimen per [facility] Therapy recommendations: CIR Disposition: Pending  Carotid stenosis CTA head & neck  right carotid bifurcation and proximal right cervical ICA-approximately 60% stenosis.  Mild atherosclerosis at the left carotid bifurcation and nearly circumferential atherosclerotic plaque along the proximal left cervical ICA-50% stenosis. Carotid Doppler pending [facility] consulted, may consider left ICA TCAR On DAPT  Hypertension Home meds: None, elevated BP in ED with readings of 203/97 and 192/100  Currently stable on the high end Gradually normalize BP in 2 to 3 days Hydralazine  as needed  Long-term BP goal normotension  Hyperlipidemia Home meds: None LDL 82, goal < 70 Add Crestor  20 mg daily Continue statin at discharge  Diabetes type II Uncontrolled Home meds: Glipizide 10 mg daily, Janumet 50-500 twice daily HgbA1c 8.0, goal < 7.0 CBGs SSI Recommend close follow-up with PCP for better DM control  Other Stroke Risk Factors Obesity, Body mass index is 37.01 kg/m., BMI >/= 30 associated with increased stroke risk, recommend weight loss, diet and exercise as appropriate  Self-reported significant stress  Other Active Problems Leukocytosis, WBC 11.0--12.0, primary team managing  Hospital day # 1    ATTENDING NOTE: I reviewed above note and agree with the assessment and plan. Pt was seen and examined.   Friends at bedside.  Patient lying bed, still has mild right facial droop and right hand clumsiness.  Otherwise neuro intact.  MRI showed left CR infarct, CT head and neck concerning for bilateral ICA stenosis.  Carotid Doppler pending.  Vascular surgery consulted, may consider left TCAR.  On DAPT and statin.  Risk factor modification education provided.  Will follow-up  For detailed assessment and plan, please refer to above as I have made changes wherever appropriate.   Ary Cummins, MD PhD Stroke Neurology 06/09/2024 3:16 PM  I discussed with Dr. Lanis [facility]. I spent additional 30 minutes in patient face-to-face time with the patient and family, reviewing test results,  images and medication, and discussing the diagnosis, treatment plan and potential prognosis. This patient's care requiresreview of multiple databases, neurological assessment, discussion with family, other specialists and medical decision making of high complexity.      To contact Stroke Continuity provider, please refer to [email or web address]. After hours, contact General Neurology

## 2024-06-10 ENCOUNTER — Inpatient Hospital Stay (HOSPITAL_COMMUNITY)

## 2024-06-10 ENCOUNTER — Other Ambulatory Visit (HOSPITAL_COMMUNITY)

## 2024-06-10 DIAGNOSIS — I63232 Cerebral infarction due to unspecified occlusion or stenosis of left carotid arteries: Secondary | ICD-10-CM

## 2024-06-10 DIAGNOSIS — I6389 Other cerebral infarction: Secondary | ICD-10-CM | POA: Diagnosis not present

## 2024-06-10 DIAGNOSIS — R29702 NIHSS score 2: Secondary | ICD-10-CM

## 2024-06-10 DIAGNOSIS — I779 Disorder of arteries and arterioles, unspecified: Secondary | ICD-10-CM

## 2024-06-10 DIAGNOSIS — I639 Cerebral infarction, unspecified: Secondary | ICD-10-CM | POA: Diagnosis not present

## 2024-06-10 DIAGNOSIS — E785 Hyperlipidemia, unspecified: Secondary | ICD-10-CM

## 2024-06-10 LAB — ECHOCARDIOGRAM COMPLETE
AR max vel: 2.4 cm2
AV Area VTI: 2.73 cm2
AV Area mean vel: 2.41 cm2
AV Mean grad: 3 mmHg
AV Peak grad: 5.7 mmHg
Ao pk vel: 1.19 m/s
Area-P 1/2: 3.06 cm2
Height: 64 in
S' Lateral: 2.8 cm
Weight: 3449.76 [oz_av]

## 2024-06-10 LAB — GLUCOSE, CAPILLARY
Glucose-Capillary: 252 mg/dL — ABNORMAL HIGH (ref 70–99)
Glucose-Capillary: 249 mg/dL — ABNORMAL HIGH (ref 70–99)
Glucose-Capillary: 239 mg/dL — ABNORMAL HIGH (ref 70–99)
Glucose-Capillary: 270 mg/dL — ABNORMAL HIGH (ref 70–99)

## 2024-06-10 MED ORDER — INSULIN ASPART 100 UNIT/ML IJ SOLN
0.0000 [IU] | Freq: Every day | INTRAMUSCULAR | Status: DC
  Administered 2024-06-10: 3 [IU] via SUBCUTANEOUS

## 2024-06-10 MED ORDER — INSULIN GLARGINE 100 UNIT/ML ~~LOC~~ SOLN
8.0000 [IU] | Freq: Two times a day (BID) | SUBCUTANEOUS | Status: DC
  Administered 2024-06-10: 8 [IU] via SUBCUTANEOUS
  Filled 2024-06-10 (×3): qty 0.08

## 2024-06-10 MED ORDER — INSULIN ASPART 100 UNIT/ML IJ SOLN
0.0000 [IU] | Freq: Three times a day (TID) | INTRAMUSCULAR | Status: DC
  Administered 2024-06-10: 8 [IU] via SUBCUTANEOUS
  Administered 2024-06-10 – 2024-06-11 (×3): 5 [IU] via SUBCUTANEOUS

## 2024-06-10 MED ORDER — INSULIN GLARGINE 100 UNIT/ML ~~LOC~~ SOLN
12.0000 [IU] | Freq: Every day | SUBCUTANEOUS | Status: DC
  Administered 2024-06-10: 12 [IU] via SUBCUTANEOUS
  Filled 2024-06-10: qty 0.12

## 2024-06-10 NOTE — Progress Notes (Addendum)
 STROKE TEAM PROGRESS NOTE   INTERIM HISTORY/SUBJECTIVE Family is at the bedside. Pt still has slight right facial droop but right hand clumsiness has resolved. Dr. Lanis from [facility] saw pt today and decided left TCAR in 7 days.   OBJECTIVE  CBC    Component Value Date/Time   WBC 12.0 (H) 06/09/2024 0353   RBC 3.84 (L) 06/09/2024 0353   HGB 11.2 (L) 06/09/2024 0353   HCT 33.5 (L) 06/09/2024 0353   PLT 357 06/09/2024 0353   MCV 87.2 06/09/2024 0353   MCH 29.2 06/09/2024 0353   MCHC 33.4 06/09/2024 0353   RDW 12.5 06/09/2024 0353   LYMPHSABS 2.6 06/08/2024 1451   MONOABS 0.7 06/08/2024 1451   EOSABS 0.5 06/08/2024 1451   BASOSABS 0.1 06/08/2024 1451    BMET    Component Value Date/Time   NA 134 (L) 06/08/2024 1451   K 4.4 06/08/2024 1451   CL 98 06/08/2024 1451   CO2 19 (L) 06/08/2024 1451   GLUCOSE 232 (H) 06/08/2024 1451   BUN 14 06/08/2024 1451   CREATININE 0.97 06/09/2024 0353   CALCIUM  9.9 06/08/2024 1451   GFRNONAA >60 06/09/2024 0353    IMAGING past 24 hours VAS US  CAROTID Result Date: 06/10/2024 Carotid Arterial Duplex Study Patient Name:  Hailey Johnson  Date of Exam:   06/09/2024 Medical Rec #: [id number]       Accession #:    [id number] Date of Birth: 06-Jun-1946        Patient Gender: F Patient Age:   78 years Exam Location:  [facility] Procedure:      VAS US  CAROTID Referring Phys: ARY CUMMINS --------------------------------------------------------------------------------  Indications:       CVA and Speech disturbance. Risk Factors:      Hypertension, hyperlipidemia, Diabetes, no history of                    smoking. Other Factors:     CTA of head and neck done 9/12 indicated right carotid                    bifurcation and proximal right cervical ICA_approximately 60%                    stenosis. Mild atherosclerosis at the left carotid                    bifurcation and nearly circumferential atherosclerotic plaque                    along the proximal left  cervical ICA_50% stenosis. Limitations        Today's exam was limited due to the high bifurcation of the                    carotid and the body habitus of the patient. Comparison Study:  No prior study on file Performing Technologist: Alberta Lis RVS  Examination Guidelines: A complete evaluation includes B-mode imaging, spectral Doppler, color Doppler, and power Doppler as needed of all accessible portions of each vessel. Bilateral testing is considered an integral part of a complete examination. Limited examinations for reoccurring indications may be performed as noted.  Right Carotid Findings: +----------+--------+--------+--------+----------------------+--------+           PSV cm/sEDV cm/sStenosisPlaque Description    Comments +----------+--------+--------+--------+----------------------+--------+ CCA Prox  98      8  heterogenous                   +----------+--------+--------+--------+----------------------+--------+ CCA Distal79      15              heterogenous                   +----------+--------+--------+--------+----------------------+--------+ ICA Prox  102     25      1-39%   irregular and calcific         +----------+--------+--------+--------+----------------------+--------+ ICA Mid   105     30                                             +----------+--------+--------+--------+----------------------+--------+ ICA Distal105     29                                             +----------+--------+--------+--------+----------------------+--------+ ECA       111     20                                             +----------+--------+--------+--------+----------------------+--------+ +----------+--------+-------+--------+-------------------+           PSV cm/sEDV cmsDescribeArm Pressure (mmHG) +----------+--------+-------+--------+-------------------+ [id number]                                         +----------+--------+-------+--------+-------------------+ +---------+--------+--+--------+--+ VertebralPSV cm/s47EDV cm/s10 +---------+--------+--+--------+--+  Left Carotid Findings: +----------+--------+--------+--------+------------------+-------------------+           PSV cm/sEDV cm/sStenosisPlaque DescriptionComments            +----------+--------+--------+--------+------------------+-------------------+ CCA Prox  92      20                                                    +----------+--------+--------+--------+------------------+-------------------+ CCA Distal78      18                                                    +----------+--------+--------+--------+------------------+-------------------+ ICA Prox  63      15      1-39%                                         +----------+--------+--------+--------+------------------+-------------------+ ICA Mid   82      21                                tortuous            +----------+--------+--------+--------+------------------+-------------------+ ICA Distal  not well visualized +----------+--------+--------+--------+------------------+-------------------+ ECA       69      9                                                     +----------+--------+--------+--------+------------------+-------------------+ +----------+--------+--------+--------+-------------------+           PSV cm/sEDV cm/sDescribeArm Pressure (mmHG) +----------+--------+--------+--------+-------------------+ Subclavian105                                         +----------+--------+--------+--------+-------------------+ +---------+--------+--+--------+--+ VertebralPSV cm/s46EDV cm/s13 +---------+--------+--+--------+--+   Summary: Right Carotid: Velocities in the right ICA are consistent with a 1-39% stenosis. Left Carotid: Velocities in the left ICA are consistent with a 1-39%  stenosis. Vertebrals:  Bilateral vertebral arteries demonstrate antegrade flow. Subclavians: Normal flow hemodynamics were seen in bilateral subclavian              arteries. *See table(s) above for measurements and observations.  Electronically signed by Fonda Rim on 06/10/2024 at 9:15:20 AM.    Final     Vitals:   06/09/24 2024 06/09/24 2334 06/10/24 0329 06/10/24 0758  BP:  (!) 164/73 (!) 150/77 (!) 191/78  Pulse: 99 97 85 90  Resp: 16 13 19 15   Temp:  98.3 F (36.8 C) 98.2 F (36.8 C) 97.8 F (36.6 C)  TempSrc:  Oral Oral Oral  SpO2: 98% 100% 96% 98%  Weight:      Height:       PHYSICAL EXAM General:  Alert, well-nourished, well-developed patient in no acute distress Psych: Stressed due to husband's recent strokes, car totaled, and citations pending CV: Regular rate and rhythm on monitor Respiratory:  Regular, unlabored respirations on room air  NEURO:  Mental Status: AA&Ox3, patient is able to give coherent history Speech/Language: speech with significant dysarthria but without aphasia.  Naming, repetition, fluency, and comprehension intact.  Cranial Nerves:  II: PERRL. Visual fields full.  III, IV, VI: EOMI. Eyelids elevate symmetrically.  V: Sensation is intact to light touch and symmetrical to face.  VII: Mild right facial droop VIII: hearing intact to voice. IX, X: Palate elevates symmetrically. Phonation is normal.  KP:Dynloizm shrug 5/5. XII: tongue is midline without fasciculations. Motor: 5/5 strength LLE, RLE, LUE and 4+/5 RUE Tone: is normal and bulk is normal Sensation- Intact to light touch bilaterally. Extinction absent to light touch to DSS.   Coordination: FTN intact bilaterally, HKS: no ataxia in BLE. No drift.  Gait- deferred  Most Recent NIH 2   ASSESSMENT/PLAN  [name] is a 78 y.o. female with history of hypertension, hyperlipidemia, obesity, type 2 diabetes admitted for slurred speech was hypertensive on arrival with readings of  203/97 and 192/100.   Stroke: left CR infarct, etiology likely from large vessel disease from left MCA and ICA stenosis CT head with no acute abnormality. CTA head & neck showed no LVO, some irregularity and moderate narrowing of proximal M2 branch of left MCA.  Moderate to severe long segment narrowing of right ACA from distal A2 segment to proximal V4 segment.  Irregularity and moderate narrowing of anterior P2 segment of left PCA, atherosclerosis of the right carotid bifurcation and proximal right cervical ICA-approximately 60% stenosis.  Mild atherosclerosis at the left carotid  bifurcation and nearly circumferential atherosclerotic plaque along the proximal left cervical ICA-50% stenosis. MRI brain showed acute nonhemorrhagic infarct along left lenticular form nucleus and corona radiata, mildly advanced periventricular and subcortical T2 hyperintensities, worse on the left.  Some remote lacunar infarcts in the left corona radiata. Carotid Doppler unremarkable, but likely underestimate 2D Echo EF 60-65% LDL 82 HgbA1c 8.0 VTE prophylaxis - heparin  subcu No antithrombotic prior to admission, now on aspirin  81 mg daily and clopidogrel  75 mg daily.  Final regimen per [facility] Therapy recommendations: CIR Disposition: Pending  Carotid stenosis CTA head & neck right carotid bifurcation and proximal right cervical ICA-approximately 60% stenosis.  Mild atherosclerosis at the left carotid bifurcation and nearly circumferential atherosclerotic plaque along the proximal left cervical ICA-50% stenosis. Carotid Doppler unremarkable, but likely underestimate [facility] consulted and will consider left ICA TCAR on 9/22 On DAPT  Hypertension Home meds: None, elevated BP in ED with readings of 203/97 and 192/100  Currently stable on the high end Gradually normalize BP in 2 to 3 days Hydralazine  as needed  Long-term BP goal normotension  Hyperlipidemia Home meds: None LDL 82, goal < 70 Add Crestor  20 mg  daily Continue statin at discharge  Diabetes type II Uncontrolled Home meds: Glipizide 10 mg daily, Janumet 50-500 twice daily HgbA1c 8.0, goal < 7.0 hyperglycemia CBGs SSI Recommend close follow-up with PCP for better DM control  Other Stroke Risk Factors Obesity, Body mass index is 37.01 kg/m., BMI >/= 30 associated with increased stroke risk, recommend weight loss, diet and exercise as appropriate  Self-reported significant stress  Other Active Problems Leukocytosis, WBC 11.0--12.0, primary team managing  Hospital day # 2   Neurology will sign off. Please call with questions. Pt will follow up with stroke clinic NP at [facility] in about 4 weeks. Thanks for the consult.    Ary Cummins, MD PhD Stroke Neurology 06/10/2024 10:51 AM   To contact Stroke Continuity provider, please refer to [email or web address]. After hours, contact General Neurology

## 2024-06-10 NOTE — Evaluation (Signed)
 Speech Language Pathology Evaluation Patient Details Name: Hailey Johnson MRN: [id number] DOB: 1946/04/21 Today's Date: 06/10/2024 Time: 8979-8961 SLP Time Calculation (min) (ACUTE ONLY): 18 min  Problem List:  Patient Active Problem List   Diagnosis Date Noted   CVA (cerebral vascular accident) (HCC) 06/08/2024   Stroke (cerebrum) (HCC) 06/08/2024   Past Medical History: History reviewed. No pertinent past medical history. Past Surgical History: History reviewed. No pertinent surgical history. HPI:  Pt is a 78 y.o. female who presented to [facility] on [facility]24 with c/o slurred speech x24 hours. 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 Pt with minimal right-sided facial droop, dysarthria, on DAPT, LDL was above goal with statin added, A1c high as well, diabetic education provided. PMH: HTN, DMII, HLD, obesity   Assessment / Plan / Recommendation Clinical Impression  Pt scored 28/30 on the SLUMS, which is WNL. She shows some reduced reasoning and safety awareness related to onset of symptoms and delay in seeking care, but pt reports that she did not realize that speech changes along could be a symptom of a stroke. Education about BE FAST was provided. Two instances of anomia were noted associated with medical terminology, with no other evidence of this found. Pt and her two daughters present note that her mentation appears to be at baseline, and that she seems to be improved today compared to yesterday. Her speech does remain slurred with imprecise articulation, for which she would benefit from SLP f/u at next level of care.     SLP Assessment  SLP Recommendation/Assessment: All further Speech Language Pathology needs can be addressed in the next venue of care SLP Visit Diagnosis: Dysarthria and anarthria (R47.1)     Assistance Recommended at Discharge  Intermittent Supervision/Assistance   Functional Status Assessment Patient has not had a recent decline in their functional status  Frequency and Duration           SLP Evaluation Cognition  Overall Cognitive Status: Within Functional Limits for tasks assessed       Comprehension  Auditory Comprehension Overall Auditory Comprehension: Appears within functional limits for tasks assessed    Expression Expression Primary Mode of Expression: Verbal Verbal Expression Overall Verbal Expression: Appears within functional limits for tasks assessed   Oral / Motor  Motor Speech Overall Motor Speech: Impaired Respiration: Within functional limits Phonation: Normal Resonance: Within functional limits Articulation: Impaired Level of Impairment: Conversation Intelligibility: Intelligibility reduced Conversation: 75-100% accurate            Leita SAILOR., M.A. CCC-SLP Acute Rehabilitation Services Office: [phone]  Secure chat preferred  06/10/2024, 12:51 PM

## 2024-06-10 NOTE — Consult Note (Addendum)
 Hospital Consult    Reason for Consult:  CVA, carotid artery stenosis Requesting Physician:  Neurology MRN #:  [id number]  History of Present Illness: This is a 78 y.o. female with past medical history significant for hypertension, hyperlipidemia, and diabetes mellitus.  She presented to the ED on Friday afternoon due to slurred speech which occurred a day or 2 prior.  On exam she continues to have slurred speech and also endorses fine motor deficit in her right hand.  Workup included CTA demonstrating at least 50% stenosis of bilateral internal carotid arteries.  Workup also included MR brain demonstrating acute left-sided infarct.  She has been started on aspirin , Plavix , and statin daily.  Her daughter is present during exam.  History reviewed. No pertinent past medical history.  History reviewed. No pertinent surgical history.  Allergies  Allergen Reactions   Antihistamines, Chlorpheniramine-Type Palpitations   Latex Dermatitis   Pseudoephedrine Hcl Palpitations    Prior to Admission medications   Medication Sig Start Date End Date Taking? Authorizing Provider  glipiZIDE (GLUCOTROL XL) 10 MG 24 hr tablet Take 10 mg by mouth daily with breakfast.   Yes [provider]  sitaGLIPtin-metformin (JANUMET) 50-500 MG tablet Take 2 tablets by mouth daily.   Yes [provider]    Social History   Socioeconomic History   Marital status: Married    Spouse name: Not on file   Number of children: Not on file   Years of education: Not on file   Highest education level: Not on file  Occupational History   Not on file  Tobacco Use   Smoking status: Never   Smokeless tobacco: Never  Vaping Use   Vaping status: Never Used  Substance and Sexual Activity   Alcohol  use: Never   Drug use: Never   Sexual activity: Never  Other Topics Concern   Not on file  Social History Narrative   Not on file   Social Drivers of Health   Financial Resource Strain: Not on file   Food Insecurity: No Food Insecurity (06/08/2024)   Hunger Vital Sign    Worried About Running Out of Food in the Last Year: Never true    Ran Out of Food in the Last Year: Never true  Transportation Needs: No Transportation Needs (06/08/2024)   PRAPARE - Administrator, Civil Service (Medical): No    Lack of Transportation (Non-Medical): No  Physical Activity: Not on file  Stress: Not on file  Social Connections: Socially Integrated (06/08/2024)   Social Connection and Isolation Panel    Frequency of Communication with Friends and Family: More than three times a week    Frequency of Social Gatherings with Friends and Family: Three times a week    Attends Religious Services: More than 4 times per year    Active Member of Clubs or Organizations: Yes    Attends Banker Meetings: 1 to 4 times per year    Marital Status: Married  Catering manager Violence: Not At Risk (06/08/2024)   Humiliation, Afraid, Rape, and Kick questionnaire    Fear of Current or Ex-Partner: No    Emotionally Abused: No    Physically Abused: No    Sexually Abused: No    History reviewed. No pertinent family history.  ROS: Otherwise negative unless mentioned in HPI  Physical Examination  Vitals:   06/10/24 0329 06/10/24 0758  BP: (!) 150/77 (!) 191/78  Pulse: 85 90  Resp: 19 15  Temp: 98.2 F (  36.8 C) 97.8 F (36.6 C)  SpO2: 96% 98%   Body mass index is 37.01 kg/m.  General:  WDWN in NAD Gait: Not observed HENT: WNL, normocephalic Pulmonary: normal non-labored breathing Cardiac: regular Abdomen:  soft, NT/ND, no masses Skin: without rashes Vascular Exam/Pulses: Symmetrical radial pulses Extremities: without ischemic changes, without Gangrene , without cellulitis; without open wounds;  Musculoskeletal: no muscle wasting or atrophy  Neurologic: A&O X 3; slurred speech; mild fine motor deficit right hand Psychiatric:  The pt has Normal affect. Lymph:  Unremarkable  CBC     Component Value Date/Time   WBC 12.0 (H) 06/09/2024 0353   RBC 3.84 (L) 06/09/2024 0353   HGB 11.2 (L) 06/09/2024 0353   HCT 33.5 (L) 06/09/2024 0353   PLT 357 06/09/2024 0353   MCV 87.2 06/09/2024 0353   MCH 29.2 06/09/2024 0353   MCHC 33.4 06/09/2024 0353   RDW 12.5 06/09/2024 0353   LYMPHSABS 2.6 06/08/2024 1451   MONOABS 0.7 06/08/2024 1451   EOSABS 0.5 06/08/2024 1451   BASOSABS 0.1 06/08/2024 1451    BMET    Component Value Date/Time   NA 134 (L) 06/08/2024 1451   K 4.4 06/08/2024 1451   CL 98 06/08/2024 1451   CO2 19 (L) 06/08/2024 1451   GLUCOSE 232 (H) 06/08/2024 1451   BUN 14 06/08/2024 1451   CREATININE 0.97 06/09/2024 0353   CALCIUM  9.9 06/08/2024 1451   GFRNONAA >60 06/09/2024 0353    COAGS: Lab Results  Component Value Date   INR 1.0 06/08/2024     Non-Invasive Vascular Imaging:   CTA demonstrating at least 50% stenosis of bilateral internal carotid arteries  MR brain positive for left-sided CVA    ASSESSMENT/PLAN: This is a 78 y.o. female with left-sided CVA  Hailey Johnson is a 78 year old female who presented to the emergency department with slurred speech.  Workup included MRI brain which was positive for left-sided CVA.  She also had a CTA demonstrating at least 50% stenosis of bilateral internal carotid arteries.  Imaging likely represents asymptomatic left ICA stenosis.  We discussed left carotid endarterectomy versus TCAR.  From a vascular standpoint she can be discharged home and follow-up as an outpatient for surgery.  This will likely occur next Monday, 06/18/2024.  She should continue aspirin , Plavix , statin daily.  On-call vascular surgeon Dr. Lanis was also involved with the evaluation management plan of this patient.   Hailey Sender PA-C [facility] [phone]   VASCULAR STAFF ADDENDUM: I have independently interviewed and examined the patient. I agree with the above.  In short, patient is a 78 year old  female with left-sided CVA and CTA demonstrating at least 50% stenosis of the left ICA.  I had a nice conversation with neurology yesterday, and while the area of infarct is usually associated with small vessel disease, there Hitz and multiple vascular beds which is most consistent with an embolic phenomenon.  On exam, Hailey Johnson continues to struggle with some speech related issues.  Bilateral strength is normal.  I had a nice conversation with both her and her daughter regarding the need for revascularization.  We discussed that with revascularization I take her risk of stroke at 2 years from 26% to 9%.  We discussed carotid endarterectomy versus transcarotid artery revascularization.  After discussing the risks and benefits of both which shared decision making, she elected to proceed with transcarotid artery revascularization.  This will be planned as an outpatient for next Monday.  Please continue dual  antiplatelet therapy, high intensity statin through the surgery.  Hailey Johnson FORBES Rim MD [facility] Phone Number: [phone] 06/10/2024 9:26 AM

## 2024-06-10 NOTE — Discharge Instructions (Signed)
 Follow with Primary MD Dyane Anthony RAMAN, FNP in 7 days   Get CBC, CMP, Magnesium, 2 view Chest X ray -  checked next visit with your primary MD   Activity: As tolerated with Full fall precautions use walker/cane & assistance as needed  Disposition Home    Diet: Heart Healthy Low Carb  Accuchecks 4 times/day, Once in AM empty stomach and then before each meal. Log in all results and show them to your Prim.MD in 3 days. If any glucose reading is under 80 or above 300 call your Prim MD immidiately. Follow Low glucose instructions for glucose under 80 as instructed.   Special Instructions: If you have smoked or chewed Tobacco  in the last 2 yrs please stop smoking, stop any regular Alcohol   and or any Recreational drug use.  On your next visit with your primary care physician please Get Medicines reviewed and adjusted.  Please request your Prim.MD to go over all Hospital Tests and Procedure/Radiological results at the follow up, please get all Hospital records sent to your Prim MD by signing hospital release before you go home.  If you experience worsening of your admission symptoms, develop shortness of breath, life threatening emergency, suicidal or homicidal thoughts you must seek medical attention immediately by calling 911 or calling your MD immediately  if symptoms less severe.  You Must read complete instructions/literature along with all the possible adverse reactions/side effects for all the Medicines you take and that have been prescribed to you. Take any new Medicines after you have completely understood and accpet all the possible adverse reactions/side effects.   Do not drive when taking Pain medications.  Do not take more than prescribed Pain, Sleep and Anxiety Medications  Wear Seat belts while driving.

## 2024-06-10 NOTE — Plan of Care (Signed)

## 2024-06-10 NOTE — Evaluation (Signed)
 Clinical/Bedside Swallow Evaluation Patient Details  Name: Hailey Johnson MRN: [id number] Date of Birth: November 29, 1945  Today's Date: 06/10/2024 Time: SLP Start Time (ACUTE ONLY): 1002 SLP Stop Time (ACUTE ONLY): 1020 SLP Time Calculation (min) (ACUTE ONLY): 18 min  Past Medical History: History reviewed. No pertinent past medical history. Past Surgical History: History reviewed. No pertinent surgical history. HPI:  Pt is a 78 y.o. female who presented to [facility] on 06/08/24 with c/o slurred speech x24 hours. 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 Pt with minimal right-sided facial droop, dysarthria, on DAPT, LDL was above goal with statin added, A1c high as well, diabetic education provided. PMH: HTN, DMII, HLD, obesity    Assessment / Plan / Recommendation  Clinical Impression  Pt's oropharyngeal swallow appears to be functional throughout breakfast meal. She and her daughters voice no concerns about swallowing, and she had previously passed the swallow screen on admission. Recommend that she continue with regular solids and thin liquids. No further SLP f/u needed for dysphagia.   SLP Visit Diagnosis: Dysphagia, unspecified (R13.10)    Aspiration Risk       Diet Recommendation Regular;Thin liquid    Liquid Administration via: Cup;Straw Medication Administration: Whole meds with liquid Supervision: Patient able to self feed Compensations: Slow rate;Small sips/bites Postural Changes: Seated upright at 90 degrees    Other  Recommendations Oral Care Recommendations: Oral care BID     Assistance Recommended at Discharge    Functional Status Assessment Patient has not had a recent decline in their functional status  Frequency and Duration            Prognosis        Swallow Study   General HPI: Pt is a 79 y.o. female who presented to [facility] on 06/08/24 with c/o slurred speech x24  hours. 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 Pt with minimal right-sided facial droop, dysarthria, on DAPT, LDL was above goal with statin added, A1c high as well, diabetic education provided. PMH: HTN, DMII, HLD, obesity Type of Study: Bedside Swallow Evaluation Previous Swallow Assessment: none in chart Diet Prior to this Study: Regular;Thin liquids (Level 0) Temperature Spikes Noted: No Respiratory Status: Room air History of Recent Intubation: No Behavior/Cognition: Alert;Cooperative;Pleasant mood Oral Cavity Assessment: Within Functional Limits Oral Care Completed by SLP: No Oral Cavity - Dentition: Adequate natural dentition Vision: Functional for self-feeding Self-Feeding Abilities: Able to feed self Patient Positioning: Upright in bed Baseline Vocal Quality: Normal    Oral/Motor/Sensory Function     Ice Chips Ice chips: Not tested   Thin Liquid Thin Liquid: Within functional limits Presentation: Self Fed;Straw    Nectar Thick Nectar Thick Liquid: Not tested   Honey Thick Honey Thick Liquid: Not tested   Puree Puree: Not tested   Solid     Solid: Within functional limits Presentation: Self Fed      Leita SAILOR., M.A. CCC-SLP Acute Rehabilitation Services Office: [phone]  Secure chat preferred  06/10/2024,12:47 PM

## 2024-06-10 NOTE — Plan of Care (Signed)
  Problem: Education: Goal: Knowledge of General Education information will improve Description: Including pain rating scale, medication(s)/side effects and non-pharmacologic comfort measures 06/10/2024 1925 by Maralyn Siskin, RN Outcome: Progressing 06/10/2024 1925 by Maralyn Siskin, RN Outcome: Progressing   Problem: Health Behavior/Discharge Planning: Goal: Ability to manage health-related needs will improve 06/10/2024 1925 by Maralyn Siskin, RN Outcome: Progressing 06/10/2024 1925 by Maralyn Siskin, RN Outcome: Progressing   Problem: Clinical Measurements: Goal: Ability to maintain clinical measurements within normal limits will improve 06/10/2024 1925 by Maralyn Siskin, RN Outcome: Progressing 06/10/2024 1925 by Maralyn Siskin, RN Outcome: Progressing

## 2024-06-10 NOTE — Progress Notes (Addendum)
 PROGRESS NOTE                                                                                                                                                                                                             Patient Demographics:    Hailey Johnson, is a 78 y.o. female, DOB - [birth year], FMW:[id number]  Outpatient Primary MD for the patient is Dyane Anthony RAMAN, FNP    LOS - 2  Admit date - 06/08/2024    Chief Complaint  Patient presents with   Aphasia       Brief Narrative (HPI from H&P)    78 y.o. female with medical history significant of hypertension, obesity DMII,,and hyperlipidemia Who presents to ED with complaint of slurred speech x 24 hours. Patient noted the last time her speech was note to be normal was going to bed on 2 nights ago.  Patient notes no HA, vision changes, difficulty swallowing, focal weakness or paresthesias.  Workup confirmed acute stroke.   Subjective:   Patient in bed, appears comfortable, denies any headache, no fever, no chest pain or pressure, no shortness of breath , no abdominal pain. No focal weakness.  Still has right-sided facial weakness, dysarthria.   Assessment  & Plan :     CVA - Acute nonhemorrhagic infarct in the left lentiform nucleus and corona radiata, measuring up to 11 mm, full stroke workup being done, minimal right-sided facial droop, dysarthria, on DAPT, LDL was above goal hence statin added, A1c high as well, diabetic education.  Echocardiogram pending.  Further recommendations per stroke team.  Seen by [facility] as well due to bilateral carotid artery disease they are planning to do carotid intervention most likely in a week from now, till that time patient likely will go home with home PT on 06/11/2024 and come back for carotid intervention.  Hypertension -permissive HTN, -hydralazine  prn   HLD -placed on Crestor    Mild leukocytosis  - Secondary monitor.  Bilateral  carotid artery disease.  On DAPT and statin.  [facility] to see.  Likely outpatient procedure if needed in a week from now.    DMII -iss/fs, provide diabetic and insulin  education.   Lab Results  Component Value Date   HGBA1C 8.0 (H) 06/09/2024   CBG (last 3)  Recent Labs    06/09/24 1526 06/09/24 2109 06/10/24 0757  GLUCAP 224* 239* 252*   Lab Results  Component Value Date   CHOL 166 06/09/2024   HDL 49 06/09/2024   LDLCALC 82 06/09/2024   TRIG 173 (H) 06/09/2024   CHOLHDL 3.4 06/09/2024          Condition - Extremely Guarded  Family Communication  : Husband Toribio [phone]  on 06/09/2024 at 9 AM message left, updated him and daughter on the same phone in detail on 06/10/2024  Code Status : Full code  Consults  : Neurology, [facility]  PUD Prophylaxis :     Procedures  :     Echocardiogram.    MRI.  1. Acute nonhemorrhagic infarct in the left lentiform nucleus and corona radiata, measuring up to 11 mm. 2. Mildly advanced periventricular and subcortical T2 hyperintensities for age, worse on the left. 3. More remote lacunar infarcts in the left corona radiata.  CTA head and neck - 1. No large vessel occlusion. 2. Irregularity and moderate narrowing of a proximal M2 branch of the left MCA. 3. Moderate-to-severe long segment narrowing of the right ACA from the distal A2 segment to the proximal A4 segment. 4. Irregularity and moderate narrowing of the anterior P2 segment of the left PCA. 5. Atherosclerosis at the right carotid bifurcation and proximal right cervical ICA resulting in approximately 60% stenosis. 6. Mild atherosclerosis at the left carotid bifurcation and nearly circumferential atherosclerotic plaque along the proximal left cervical ICA resulting in approximately 50% stenosis. 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 Yes; Fluid consistency: Thin  Diet effective now                    Inpatient Medications  Scheduled Meds:   stroke: early stages of recovery book   Does not apply Once   aspirin   81 mg Oral Daily   clopidogrel   75 mg Oral Daily   heparin   5,000 Units Subcutaneous Q8H   insulin  aspart  0-15 Units Subcutaneous TID WC   insulin  aspart  0-5 Units Subcutaneous QHS   insulin  glargine  12 Units Subcutaneous Daily   rosuvastatin   20 mg Oral Daily   sodium chloride  flush  3 mL Intravenous Once   Continuous Infusions:   PRN Meds:.acetaminophen  **OR** acetaminophen  (TYLENOL ) oral liquid 160 mg/5 mL **OR** acetaminophen , alum & mag hydroxide-simeth, artificial tears, guaiFENesin -dextromethorphan, hydrALAZINE , lip balm, loratadine , mouth rinse, phenol, senna-docusate, sodium chloride   Antibiotics  :    Anti-infectives (From admission, onward)    None         Objective:   Vitals:   06/09/24 2024 06/09/24 2334 06/10/24 0329 06/10/24 0758  BP:  (!) 164/73 (!) 150/77 (!) 191/78  Pulse: 99 97 85 90  Resp: 16 13 19 15   Temp:  98.3 F (36.8 C) 98.2 F (36.8 C) 97.8 F (36.6 C)  TempSrc:  Oral Oral Oral  SpO2: 98% 100% 96% 98%  Weight:      Height:        Wt Readings from Last 3 Encounters:  06/08/24 97.8 kg    No  intake or output data in the 24 hours ending 06/10/24 9178    Physical Exam  Awake Alert, No new F.N deficits, mild right-sided facial droop and dysarthria [place].AT,PERRAL Supple Neck, No JVD,   Symmetrical Chest wall movement, Good air movement bilaterally, CTAB RRR,No Gallops,Rubs or new Murmurs,  +ve B.Sounds, Abd Soft, No tenderness,   No Cyanosis, Clubbing or edema      Data Review:    Recent Labs  Lab 06/08/24 1451 06/09/24 0353  WBC 11.0* 12.0*  HGB 12.2 11.2*  HCT 35.4* 33.5*  PLT 379 357  MCV  87.2 87.2  MCH 30.0 29.2  MCHC 34.5 33.4  RDW 12.6 12.5  LYMPHSABS 2.6  --   MONOABS 0.7  --   EOSABS 0.5  --   BASOSABS 0.1  --     Recent Labs  Lab 06/08/24 1451 06/09/24 0353  NA 134*  --   K 4.4  --   CL 98  --   CO2 19*  --   ANIONGAP 17*  --   GLUCOSE 232*  --   BUN 14  --   CREATININE 0.92 0.97  AST 16  --   ALT 13  --   ALKPHOS 115  --   BILITOT 0.3  --   ALBUMIN 4.1  --   INR 1.0  --   HGBA1C  --  8.0*  CALCIUM  9.9  --       Recent Labs  Lab 06/08/24 1451 06/09/24 0353  INR 1.0  --   HGBA1C  --  8.0*  CALCIUM  9.9  --     --------------------------------------------------------------------------------------------------------------- Lab Results  Component Value Date   CHOL 166 06/09/2024   HDL 49 06/09/2024   LDLCALC 82 06/09/2024   TRIG 173 (H) 06/09/2024   CHOLHDL 3.4 06/09/2024    Lab Results  Component Value Date   HGBA1C 8.0 (H) 06/09/2024   No results for input(s): TSH, T4TOTAL, FREET4, T3FREE, THYROIDAB in the last 72 hours. No results for input(s): VITAMINB12, FOLATE, FERRITIN, TIBC, IRON, RETICCTPCT in the last 72 hours. ------------------------------------------------------------------------------------------------------------------ Cardiac Enzymes No results for input(s): CKMB, TROPONINI, MYOGLOBIN in the last 168 hours.  Invalid input(s): CK  Micro Results Recent Results (from the past 240 hours)  Resp panel by RT-PCR (RSV, Flu A&B, Covid) Anterior Nasal Swab     Status: None   Collection Time: 06/08/24  4:07 PM   Specimen: Anterior Nasal Swab  Result Value Ref Range Status   SARS Coronavirus 2 by RT PCR NEGATIVE NEGATIVE Final    Comment: (NOTE) SARS-CoV-2 target nucleic acids are NOT DETECTED.  The SARS-CoV-2 RNA is generally detectable in upper respiratory specimens during the acute phase of infection. The lowest concentration of SARS-CoV-2 viral copies this assay can detect is 138  copies/mL. A negative result does not preclude SARS-Cov-2 infection and should not be used as the sole basis for treatment or other patient management decisions. A negative result may occur with  improper specimen collection/handling, submission of specimen other than nasopharyngeal swab, presence of viral mutation(s) within the areas targeted by this assay, and inadequate number of viral copies(<138 copies/mL). A negative result must be combined with clinical observations, patient history, and epidemiological information. The expected result is Negative.  Fact Sheet for Patients:  BloggerCourse.com  Fact Sheet for Healthcare Providers:  SeriousBroker.it  This test is no t yet approved or cleared by the [place]  FDA and  has been authorized for detection and/or diagnosis of SARS-CoV-2 by FDA  under an Emergency Use Authorization (EUA). This EUA will remain  in effect (meaning this test can be used) for the duration of the COVID-19 declaration under Section 564(b)(1) of the Act, 21 U.S.C.section 360bbb-3(b)(1), unless the authorization is terminated  or revoked sooner.       Influenza A by PCR NEGATIVE NEGATIVE Final   Influenza B by PCR NEGATIVE NEGATIVE Final    Comment: (NOTE) The Xpert Xpress SARS-CoV-2/FLU/RSV plus assay is intended as an aid in the diagnosis of influenza from Nasopharyngeal swab specimens and should not be used as a sole basis for treatment. Nasal washings and aspirates are unacceptable for Xpert Xpress SARS-CoV-2/FLU/RSV testing.  Fact Sheet for Patients: BloggerCourse.com  Fact Sheet for Healthcare Providers: SeriousBroker.it  This test is not yet approved or cleared by the [place]  FDA and has been authorized for detection and/or diagnosis of SARS-CoV-2 by FDA under an Emergency Use Authorization (EUA). This EUA will remain in effect (meaning  this test can be used) for the duration of the COVID-19 declaration under Section 564(b)(1) of the Act, 21 U.S.C. section 360bbb-3(b)(1), unless the authorization is terminated or revoked.     Resp Syncytial Virus by PCR NEGATIVE NEGATIVE Final    Comment: (NOTE) Fact Sheet for Patients: BloggerCourse.com  Fact Sheet for Healthcare Providers: SeriousBroker.it  This test is not yet approved or cleared by the [place]  FDA and has been authorized for detection and/or diagnosis of SARS-CoV-2 by FDA under an Emergency Use Authorization (EUA). This EUA will remain in effect (meaning this test can be used) for the duration of the COVID-19 declaration under Section 564(b)(1) of the Act, 21 U.S.C. section 360bbb-3(b)(1), unless the authorization is terminated or revoked.  Performed at [facility], [address] [place], [place] [address]     Radiology Report VAS US  CAROTID Result Date: 06/09/2024 Carotid Arterial Duplex Study Patient Name:  BURNETTE VALENTI  Date of Exam:   06/09/2024 Medical Rec #: [id number]       Accession #:    [id number] Date of Birth: Oct 18, 1945        Patient Gender: F Patient Age:   8 years Exam Location:  [facility] Procedure:      VAS US  CAROTID Referring Phys: ARY CUMMINS --------------------------------------------------------------------------------  Indications:       CVA and Speech disturbance. Risk Factors:      Hypertension, hyperlipidemia, Diabetes, no history of                    smoking. Other Factors:     CTA of head and neck done 9/12 indicated right carotid                    bifurcation and proximal right cervical ICA_approximately 60%                    stenosis. Mild atherosclerosis at the left carotid                    bifurcation and nearly circumferential atherosclerotic plaque                    along the proximal left cervical ICA_50% stenosis. Limitations         Today's exam was limited due to the high bifurcation of the                    carotid and the body habitus of the  patient. Comparison Study:  No prior study on file Performing Technologist: Alberta Lis RVS  Examination Guidelines: A complete evaluation includes B-mode imaging, spectral Doppler, color Doppler, and power Doppler as needed of all accessible portions of each vessel. Bilateral testing is considered an integral part of a complete examination. Limited examinations for reoccurring indications may be performed as noted.  Right Carotid Findings: +----------+--------+--------+--------+----------------------+--------+           PSV cm/sEDV cm/sStenosisPlaque Description    Comments +----------+--------+--------+--------+----------------------+--------+ CCA Prox  98      8               heterogenous                   +----------+--------+--------+--------+----------------------+--------+ CCA Distal79      15              heterogenous                   +----------+--------+--------+--------+----------------------+--------+ ICA Prox  102     25      1-39%   irregular and calcific         +----------+--------+--------+--------+----------------------+--------+ ICA Mid   105     30                                             +----------+--------+--------+--------+----------------------+--------+ ICA Distal105     29                                             +----------+--------+--------+--------+----------------------+--------+ ECA       111     20                                             +----------+--------+--------+--------+----------------------+--------+ +----------+--------+-------+--------+-------------------+           PSV cm/sEDV cmsDescribeArm Pressure (mmHG) +----------+--------+-------+--------+-------------------+ [id number]                                        +----------+--------+-------+--------+-------------------+  +---------+--------+--+--------+--+ VertebralPSV cm/s47EDV cm/s10 +---------+--------+--+--------+--+  Left Carotid Findings: +----------+--------+--------+--------+------------------+-------------------+           PSV cm/sEDV cm/sStenosisPlaque DescriptionComments            +----------+--------+--------+--------+------------------+-------------------+ CCA Prox  92      20                                                    +----------+--------+--------+--------+------------------+-------------------+ CCA Distal78      18                                                    +----------+--------+--------+--------+------------------+-------------------+ ICA Prox  63      15      1-39%                                         +----------+--------+--------+--------+------------------+-------------------+  ICA Mid   82      21                                tortuous            +----------+--------+--------+--------+------------------+-------------------+ ICA Distal                                          not well visualized +----------+--------+--------+--------+------------------+-------------------+ ECA       69      9                                                     +----------+--------+--------+--------+------------------+-------------------+ +----------+--------+--------+--------+-------------------+           PSV cm/sEDV cm/sDescribeArm Pressure (mmHG) +----------+--------+--------+--------+-------------------+ Subclavian105                                         +----------+--------+--------+--------+-------------------+ +---------+--------+--+--------+--+ VertebralPSV cm/s46EDV cm/s13 +---------+--------+--+--------+--+   Summary: Right Carotid: Velocities in the right ICA are consistent with a 1-39% stenosis. Left Carotid: Velocities in the left ICA are consistent with a 1-39% stenosis. Vertebrals:  Bilateral vertebral arteries demonstrate  antegrade flow. Subclavians: Normal flow hemodynamics were seen in bilateral subclavian              arteries. *See table(s) above for measurements and observations.     Preliminary    CT ANGIO HEAD NECK W WO CM Result Date: 06/08/2024 EXAM: CT HEAD WITHOUT CTA HEAD AND NECK WITH AND WITHOUT 06/08/2024 05:05:43 PM TECHNIQUE: CTA of the head and neck was performed with and without the administration of intravenous contrast. Noncontrast CT of the head with reconstructed 2-D images are also provided for review. Multiplanar 2D and/or 3D reformatted images are provided for review. Automated exposure control, iterative reconstruction, and/or weight based adjustment of the mA/kV was utilized to reduce the radiation dose to as low as reasonably achievable. COMPARISON: Same day CT head and MRI head CLINICAL HISTORY: Stroke dysarthria. FINDINGS: CT HEAD: BRAIN AND VENTRICLES: No acute intracranial hemorrhage. No mass effect. No midline shift. No extra-axial fluid collection. No evidence of acute infarct. No hydrocephalus. ORBITS: No acute abnormality. SINUSES AND MASTOIDS: No acute abnormality. CTA NECK: AORTIC ARCH AND ARCH VESSELS: Mild atherosclerosis of the aortic arch. CERVICAL CAROTID ARTERIES: The right carotid artery is patent from the origin to the skull base. Atherosclerosis of the distal right common carotid artery resulting in mild stenosis. Additional atherosclerosis at the carotid bifurcation and proximal right cervical ICA resulting in approximately 60% stenosis of the proximal right cervical ICA. The left carotid artery is patent from the origin to the skull base. Mild atherosclerosis at the carotid bifurcation. There is additional nearly circumferential atherosclerotic plaque along the proximal left cervical ICA. There is approximately 50% stenosis of the proximal cervical ICA just proximal to this point of maximal narrowing. 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  The vessels course through the skull base through separate canals and then join at the posterior genu of the petrous ICA. CERVICAL VERTEBRAL ARTERIES: The vertebral  arteries are patent from the origins to the vertebrobasilar confluence. Tortuosity of the left V1 segment. Atherosclerosis of the left V4 segment resulting in mild stenosis. LUNGS AND MEDIASTINUM: Unremarkable. SOFT TISSUES: No acute abnormality. BONES: Degenerative changes in the visualized spine. Prominent disc osteophyte complex is most pronounced at C5-6, resulting in at least moderate spinal canal stenosis. CTA HEAD: ANTERIOR CIRCULATION: The intracranial internal carotid arteries are patent bilaterally. Mild atherosclerosis of the carotid siphons without hemodynamically significant stenosis. There is irregularity and moderate narrowing of a proximal M2 branch of the left MCA. The middle cerebral arteries are patent bilaterally. Hypoplastic right A1 segment. The anterior cerebral arteries are patent. There is moderate-to-severe long segment narrowing of the right ACA from the distal A2 segment to the proximal A4 segment. POSTERIOR CIRCULATION: The posterior cerebral arteries are patent bilaterally, with irregularity and moderate narrowing of the anterior P2 segment of the left PCA. OTHER: No dural venous sinus thrombosis on this non-dedicated study. IMPRESSION: 1. No large vessel occlusion. 2. Irregularity and moderate narrowing of a proximal M2 branch of the left MCA. 3. Moderate-to-severe long segment narrowing of the right ACA from the distal A2 segment to the proximal A4 segment. 4. Irregularity and moderate narrowing of the anterior P2 segment of the left PCA. 5. Atherosclerosis at the right carotid bifurcation and proximal right cervical ICA resulting in approximately 60% stenosis. 6. Mild atherosclerosis at the left carotid bifurcation and nearly circumferential atherosclerotic plaque along the proximal left cervical ICA resulting in  approximately 50% stenosis. 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 Electronically signed by: Donnice Mania MD 06/08/2024 05:45 PM EDT RP Workstation: [id number]   DG Chest 2 View Result Date: 06/08/2024 CLINICAL DATA:  cough EXAM: CHEST - 2 VIEW COMPARISON:  September 25, 2009 FINDINGS: No focal airspace consolidation, pleural effusion, or pneumothorax. No cardiomegaly. Tortuous aorta with aortic atherosclerosis. No acute fracture or destructive lesions. Multilevel thoracic osteophytosis. Bilateral AC joint osteoarthritis. IMPRESSION: No acute cardiopulmonary abnormality. Electronically Signed   By: Rogelia Myers M.D.   On: 06/08/2024 16:26   MR BRAIN WO CONTRAST Result Date: 06/08/2024 EXAM: MRI BRAIN WITHOUT CONTRAST 06/08/2024 03:58:53 PM TECHNIQUE: Multiplanar multisequence MRI of the head/brain was performed without the administration of intravenous contrast. COMPARISON: None available. CLINICAL HISTORY: Dysarthria, right facial droop. Patient arrived POV caox4 complaining of slurred speech stating her speech has been abnormal since yesterday but thought it would just get better. Last known well Wednesday night. Denies numbness/weakness in extremities. Denies any recent trauma to the head. FINDINGS: BRAIN AND VENTRICLES: Acute nonhemorrhagic infarct in the left lentiform nucleus and corona radiata measures up to 11 mm on the coronal diffusion images. T2 and FLAIR signal is associated. Periventricular and subcortical T2 hyperintensities are mildly advanced for age, worse on the left. More remote lacunar infarcts are present in the left corona radiata. No intracranial hemorrhage. No mass. No midline shift. No hydrocephalus. The sella is unremarkable. Normal flow voids. ORBITS: No acute abnormality. SINUSES AND MASTOIDS: Mild mucosal thickening is present in the left maxillary sinus and ethmoid air cells. Small mastoid effusions are present. BONES AND SOFT TISSUES: Normal  marrow signal. No acute soft tissue abnormality. IMPRESSION: 1. Acute nonhemorrhagic infarct in the left lentiform nucleus and corona radiata, measuring up to 11 mm. 2. Mildly advanced periventricular and subcortical T2 hyperintensities for age, worse on the left. 3. More remote lacunar infarcts in the left corona radiata. Cortical values were called to Dr. Yolande at 4:25 pm.  Electronically signed by: Lonni Necessary MD 06/08/2024 04:26 PM EDT RP Workstation: [id number]   CT HEAD WO CONTRAST Result Date: 06/08/2024 EXAM: CT HEAD WITHOUT CONTRAST 06/08/2024 03:15:00 PM TECHNIQUE: CT of the head was performed without the administration of intravenous contrast. Automated exposure control, iterative reconstruction, and/or weight based adjustment of the mA/kV was utilized to reduce the radiation dose to as low as reasonably achievable. COMPARISON: None available. CLINICAL HISTORY: Neuro deficit, acute, stroke suspected. Pt states been sick (cough/running nose), the slurred speech seemed to start yesterday at some point-pt could not say. Noted right sided facial droop and drooling-patient willing/able to stand and said she did not feel any different but is unable to talk the same. FINDINGS: BRAIN AND VENTRICLES: No acute hemorrhage. No evidence of acute infarct. No hydrocephalus. No extra-axial collection. No mass effect or midline shift. Mild atrophy is within normal limits for age. ORBITS: No acute abnormality. SINUSES: Mild mucosal thickening is present within the left maxillary sinus and ethmoid air cells. SOFT TISSUES AND SKULL: No acute soft tissue abnormality. No skull fracture. IMPRESSION: 1. No acute intracranial abnormality. 2. Mild mucosal thickening in the left maxillary sinus and ethmoid air cells. Electronically signed by: Lonni Necessary MD 06/08/2024 03:25 PM EDT RP Workstation: [id number]     Signature  -   Lavada Stank M.D on 06/10/2024 at 8:21 AM   -  To page go to [email or web address]

## 2024-06-10 NOTE — Plan of Care (Signed)

## 2024-06-11 ENCOUNTER — Other Ambulatory Visit: Payer: Self-pay

## 2024-06-11 ENCOUNTER — Other Ambulatory Visit (HOSPITAL_COMMUNITY): Payer: Self-pay

## 2024-06-11 ENCOUNTER — Telehealth (HOSPITAL_COMMUNITY): Payer: Self-pay | Admitting: Pharmacy Technician

## 2024-06-11 DIAGNOSIS — I63232 Cerebral infarction due to unspecified occlusion or stenosis of left carotid arteries: Secondary | ICD-10-CM

## 2024-06-11 DIAGNOSIS — I6522 Occlusion and stenosis of left carotid artery: Secondary | ICD-10-CM

## 2024-06-11 LAB — GLUCOSE, CAPILLARY: Glucose-Capillary: 209 mg/dL — ABNORMAL HIGH (ref 70–99)

## 2024-06-11 MED ORDER — LANTUS SOLOSTAR 100 UNIT/ML ~~LOC~~ SOPN
18.0000 [IU] | PEN_INJECTOR | Freq: Every day | SUBCUTANEOUS | 0 refills | Status: AC
  Filled 2024-06-11: qty 6, 33d supply, fill #0

## 2024-06-11 MED ORDER — ROSUVASTATIN CALCIUM 20 MG PO TABS
20.0000 mg | ORAL_TABLET | Freq: Every day | ORAL | 0 refills | Status: AC
  Filled 2024-06-11: qty 30, 30d supply, fill #0

## 2024-06-11 MED ORDER — BLOOD GLUCOSE TEST VI STRP
1.0000 | ORAL_STRIP | Freq: Three times a day (TID) | 0 refills | Status: DC
  Filled 2024-06-11: qty 100, 34d supply, fill #0

## 2024-06-11 MED ORDER — TECHLITE PEN NEEDLES 32G X 4 MM MISC
1.0000 | Freq: Four times a day (QID) | 0 refills | Status: AC
  Filled 2024-06-11: qty 100, 25d supply, fill #0

## 2024-06-11 MED ORDER — ASPIRIN 81 MG PO CHEW
81.0000 mg | CHEWABLE_TABLET | Freq: Every day | ORAL | 1 refills | Status: AC
  Filled 2024-06-11: qty 30, 30d supply, fill #0

## 2024-06-11 MED ORDER — INSULIN GLARGINE 100 UNIT/ML ~~LOC~~ SOLN: 18.0000 [IU] | Freq: Two times a day (BID) | SUBCUTANEOUS | Status: DC

## 2024-06-11 MED ORDER — LANCET DEVICE MISC
1.0000 | Freq: Three times a day (TID) | 0 refills | Status: DC
  Filled 2024-06-11: qty 1, 30d supply, fill #0

## 2024-06-11 MED ORDER — INSULIN GLARGINE 100 UNIT/ML ~~LOC~~ SOLN
18.0000 [IU] | Freq: Every day | SUBCUTANEOUS | Status: DC
  Administered 2024-06-11: 18 [IU] via SUBCUTANEOUS
  Filled 2024-06-11: qty 0.18

## 2024-06-11 MED ORDER — INSULIN ASPART 100 UNIT/ML FLEXPEN
PEN_INJECTOR | SUBCUTANEOUS | 0 refills | Status: AC
  Filled 2024-06-11: qty 9, 30d supply, fill #0

## 2024-06-11 MED ORDER — ONETOUCH DELICA PLUS LANCET33G MISC
1.0000 | Freq: Three times a day (TID) | 0 refills | Status: DC
  Filled 2024-06-11: qty 100, 30d supply, fill #0

## 2024-06-11 MED ORDER — BLOOD GLUCOSE MONITOR SYSTEM W/DEVICE KIT
1.0000 | PACK | Freq: Three times a day (TID) | 0 refills | Status: DC
  Filled 2024-06-11: qty 1, 30d supply, fill #0

## 2024-06-11 MED ORDER — CLOPIDOGREL BISULFATE 75 MG PO TABS
75.0000 mg | ORAL_TABLET | Freq: Every day | ORAL | 0 refills | Status: AC
  Filled 2024-06-11: qty 30, 30d supply, fill #0

## 2024-06-11 NOTE — Discharge Summary (Signed)
 Hailey Johnson FMW:[id number] DOB: Sep 30, 1945 DOA: 06/08/2024  PCP: Hailey Anthony RAMAN, FNP  Admit date: 06/08/2024  Discharge date: 06/11/2024  Admitted From: Home   Disposition:  Home   Recommendations for Outpatient Follow-up:   Follow up with PCP in 1-2 weeks  PCP Please obtain BMP/CBC, 2 view CXR in 1week,  (see Discharge instructions)   PCP Please follow up on the following pending results:     Home Health: PT, OT, speech if qualifies Equipment/Devices: As below Consultations: Neurology, vascular surgery, diabetic educator Discharge Condition: Stable    CODE STATUS: Full    Diet Recommendation: Heart Healthy Low Carb    Chief Complaint  Patient presents with   Aphasia     Brief history of present illness from the day of admission and additional interim summary    78 y.o. female with medical history significant of hypertension, obesity DMII,,and hyperlipidemia Who presents to ED with complaint of slurred speech x 24 hours. Patient noted the last time her speech was note to be normal was going to bed on 2 nights ago.  Patient notes no HA, vision changes, difficulty swallowing, focal weakness or paresthesias.  Workup confirmed acute stroke.                                                                  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  Seen by stroke team and VVS, for now discharged on DAPT along with statin, she will follow-up with VVS and stroke team outpatient for her stroke and also newly found bilateral carotid artery disease.   Patient for now will go home with outpatient follow-up with PCP, neurology and vascular surgery.  She will also get home speech therapy and PT if she qualifies.   Hypertension -permissive HTN, PCP to monitor thereafter.   HLD -placed on Crestor    Mild leukocytosis  - Secondary monitor.   Bilateral carotid artery disease.  On DAPT and statin.  VVS to see.  VVS thinking about outpatient procedure in 5 to 7 days they will get in touch with her.Hailey Johnson     DMII -   poor outpatient control as suggested by high A1c, placed on insulin , diabetic education, testing supplies provided, home glipizide discontinued.  PCP to monitor glycemic control.  Lab Results  Component Value Date   HGBA1C 8.0 (H) 06/09/2024   CBG (last 3)  Recent Labs    06/10/24 1605 06/10/24 2102 06/11/24 0743  GLUCAP 239* 270* 209*   Discharge diagnosis     Principal Problem:  CVA (cerebral vascular accident) [facility]) Active Problems:   Stroke (cerebrum) [facility])    Discharge instructions    Discharge Instructions     Ambulatory referral to Neurology   Complete by: As directed    Follow up with stroke clinic NP at [facility] in about 4-6 weeks. Thanks.   Discharge instructions   Complete by: As directed    Follow with Primary MD Hailey Anthony RAMAN, FNP in 7 days   Get CBC, CMP, Magnesium, 2 view Chest X ray -  checked next visit with your primary MD   Activity: As tolerated with Full fall precautions use walker/cane & assistance as needed  Disposition Home    Diet: Heart Healthy Low Carb  Accuchecks 4 times/day, Once in AM empty stomach and then before each meal. Log in all results and show them to your Prim.MD in 3 days. If any glucose reading is under 80 or above 300 call your Prim MD immidiately. Follow Low glucose instructions for glucose under 80 as instructed.   Special Instructions: If you have smoked or chewed Tobacco  in the last 2 yrs please stop smoking, stop any regular Alcohol   and or any Recreational  drug use.  On your next visit with your primary care physician please Get Medicines reviewed and adjusted.  Please request your Prim.MD to go over all Hospital Tests and Procedure/Radiological results at the follow up, please get all Hospital records sent to your Prim MD by signing hospital release before you go home.  If you experience worsening of your admission symptoms, develop shortness of breath, life threatening emergency, suicidal or homicidal thoughts you must seek medical attention immediately by calling 911 or calling your MD immediately  if symptoms less severe.  You Must read complete instructions/literature along with all the possible adverse reactions/side effects for all the Medicines you take and that have been prescribed to you. Take any new Medicines after you have completely understood and accpet all the possible adverse reactions/side effects.   Do not drive when taking Pain medications.  Do not take more than prescribed Pain, Sleep and Anxiety Medications  Wear Seat belts while driving.   Increase activity slowly   Complete by: As directed        Discharge Medications   Allergies as of 06/11/2024       Reactions   Antihistamines, Chlorpheniramine-type Palpitations   Latex Dermatitis   Pseudoephedrine Hcl Palpitations        Medication List     STOP taking these medications    glipiZIDE 10 MG 24 hr tablet Commonly known as: GLUCOTROL XL       TAKE these medications    aspirin  81 MG chewable tablet Chew 1 tablet (81 mg total) by mouth daily.   Blood Glucose Monitoring Suppl Devi 1 each by Does not apply route in the morning, at noon, and at bedtime. May substitute to any manufacturer covered by patient's insurance.   BLOOD GLUCOSE TEST STRIPS Strp 1 each by In Vitro route in the morning, at noon, and at bedtime. May substitute to any manufacturer covered by patient's insurance.   clopidogrel  75 MG tablet Commonly known as: PLAVIX  Take 1 tablet  (75 mg total) by mouth daily.   insulin  aspart 100 UNIT/ML FlexPen Commonly known as: NOVOLOG  Before each meal 3 times a day, 140-199 - 2 units, 200-250 - 4 units, 251-299 - 6 units,  300-349 - 8 units,  350 or above 10 units.   insulin  glargine 100 UNIT/ML  injection Commonly known as: Lantus  Inject 0.18 mLs (18 Units total) into the skin daily. Dispense insulin  pen if approved, if not dispense as needed syringes and needles for 1 month supply. Can switch to any approved Long acting Insulin . Diagnosis E 11.65. Start taking on: June 12, 2024   Insulin  Syringe-Needle U-100 25G X 1 1 ML Misc For 4 times a day insulin  SQ, 1 month supply. Diagnosis E11.65   Lancet Device Misc 1 each by Does not apply route in the morning, at noon, and at bedtime. May substitute to any manufacturer covered by patient's insurance.   Lancets Misc. Misc 1 each by Does not apply route in the morning, at noon, and at bedtime. May substitute to any manufacturer covered by patient's insurance.   rosuvastatin  20 MG tablet Commonly known as: CRESTOR  Take 1 tablet (20 mg total) by mouth daily.   sitaGLIPtin-metformin 50-500 MG tablet Commonly known as: JANUMET Take 2 tablets by mouth daily.         Follow-up Information     [facility]. Schedule an appointment as soon as possible for a visit in 1 month(s).   Specialty: Neurology Why: stroke clinic Contact information: [address] [place]  [address] [phone]        Hailey Anthony RAMAN, FNP. Schedule an appointment as soon as possible for a visit in 1 week(s).   Specialty: Family Medicine Contact information: [address] [place] [address] [phone]         Lanis Fonda BRAVO, MD. Schedule an appointment as soon as possible for a visit in 2 day(s).   Specialty: Vascular Surgery Contact information: [address] [place]  [address] [phone]                 Major procedures and Radiology Reports - PLEASE review detailed and final reports thoroughly  -      ECHOCARDIOGRAM COMPLETE Result Date: 06/10/2024    ECHOCARDIOGRAM REPORT   Patient Name:   Hailey Johnson Date of Exam: 06/10/2024 Medical Rec #:  [id number]      Height:       64.0 in Accession #:    [id number]     Weight:       215.6 lb Date of Birth:  [birth year]       BSA:          2.020 m Patient Age:    78 years       BP:           154/79 mmHg Patient Gender: F              HR:           96 bpm. Exam Location:  Inpatient Procedure: 2D Echo, Cardiac Doppler and Color Doppler (Both Spectral and Color            Flow Doppler were utilized during procedure). Indications:   Stroke  History:       Patient has no prior history of Echocardiogram examinations.                Stroke.  Sonographer:   Jayson Gaskins Referring      [id number] SARA-MAIZ A THOMAS Phys: IMPRESSIONS  1. Left ventricular ejection fraction, by estimation, is 60 to 65%. The left ventricle has normal function. The left ventricle has no regional wall motion abnormalities. There is mild left ventricular hypertrophy. Left ventricular diastolic parameters are consistent with Grade  I diastolic dysfunction (impaired relaxation).  2. Right ventricular systolic function is normal. The right ventricular size is normal. Tricuspid regurgitation signal is inadequate for assessing PA pressure.  3. The mitral valve is normal in structure. Trivial mitral valve regurgitation. No evidence of mitral stenosis.  4. The aortic valve is grossly normal. Aortic valve regurgitation is not visualized. No aortic stenosis is present.  5. The inferior vena cava is normal in size with greater than 50% respiratory variability, suggesting right atrial pressure of 3 mmHg. Conclusion(s)/Recommendation(s): No intracardiac source of embolism detected on this transthoracic study. Consider a transesophageal echocardiogram to exclude  cardiac source of embolism if clinically indicated. FINDINGS  Left Ventricle: Left ventricular ejection fraction, by estimation, is 60 to 65%. The left ventricle has normal function. The left ventricle has no regional wall motion abnormalities. The left ventricular internal cavity size was normal in size. There is  mild left ventricular hypertrophy. Left ventricular diastolic parameters are consistent with Grade I diastolic dysfunction (impaired relaxation). Right Ventricle: The right ventricular size is normal. No increase in right ventricular wall thickness. Right ventricular systolic function is normal. Tricuspid regurgitation signal is inadequate for assessing PA pressure. Left Atrium: Left atrial size was normal in size. Right Atrium: Right atrial size was normal in size. Pericardium: There is no evidence of pericardial effusion. Mitral Valve: The mitral valve is normal in structure. Trivial mitral valve regurgitation. No evidence of mitral valve stenosis. Tricuspid Valve: The tricuspid valve is normal in structure. Tricuspid valve regurgitation is trivial. No evidence of tricuspid stenosis. Aortic Valve: The aortic valve is grossly normal. Aortic valve regurgitation is not visualized. No aortic stenosis is present. Aortic valve mean gradient measures 3.0 mmHg. Aortic valve peak gradient measures 5.7 mmHg. Aortic valve area, by VTI measures 2.73 cm. Pulmonic Valve: The pulmonic valve was not well visualized. Pulmonic valve regurgitation is trivial. No evidence of pulmonic stenosis. Aorta: The aortic root is normal in size and structure. Venous: The inferior vena cava is normal in size with greater than 50% respiratory variability, suggesting right atrial pressure of 3 mmHg. IAS/Shunts: No atrial level shunt detected by color flow Doppler.  LEFT VENTRICLE PLAX 2D LVIDd:         4.10 cm   Diastology LVIDs:         2.80 cm   LV e' medial:    4.35 cm/s LV PW:         1.00 cm   LV E/e' medial:  18.3 LV IVS:         1.10 cm   LV e' lateral:   4.57 cm/s LVOT diam:     1.80 cm   LV E/e' lateral: 17.4 LV SV:         58 LV SV Index:   28 LVOT Area:     2.54 cm  RIGHT VENTRICLE RV S prime:     16.00 cm/s TAPSE (M-mode): 2.0 cm LEFT ATRIUM             Index        RIGHT ATRIUM          Index LA Vol (A2C):   35.6 ml 17.62 ml/m  RA Area:     8.81 cm LA Vol (A4C):   32.9 ml 16.29 ml/m  RA Volume:   15.00 ml 7.43 ml/m LA Biplane Vol: 35.5 ml 17.58 ml/m  AORTIC VALVE AV Area (Vmax):    2.40 cm AV Area (Vmean):   2.41 cm AV Area (  VTI):     2.73 cm AV Vmax:           119.00 cm/s AV Vmean:          86.100 cm/s AV VTI:            0.211 m AV Peak Grad:      5.7 mmHg AV Mean Grad:      3.0 mmHg LVOT Vmax:         112.00 cm/s LVOT Vmean:        81.600 cm/s LVOT VTI:          0.226 m LVOT/AV VTI ratio: 1.07  AORTA Ao Root diam: 2.90 cm MITRAL VALVE MV Area (PHT): 3.06 cm     SHUNTS MV Decel Time: 248 msec     Systemic VTI:  0.23 m MV E velocity: 79.70 cm/s   Systemic Diam: 1.80 cm MV A velocity: 123.00 cm/s MV E/A ratio:  0.65 Soyla Merck MD Electronically signed by Soyla Merck MD Signature Date/Time: 06/10/2024/2:20:09 PM    Final    VAS US  CAROTID Result Date: 06/10/2024 Carotid Arterial Duplex Study Patient Name:  Hailey Johnson  Date of Exam:   06/09/2024 Medical Rec #: [id number]       Accession #:    [id number] Date of Birth: March 11, 1946        Patient Gender: F Patient Age:   67 years Exam Location:  [facility] Procedure:      VAS US  CAROTID Referring Phys: ARY CUMMINS --------------------------------------------------------------------------------  Indications:       CVA and Speech disturbance. Risk Factors:      Hypertension, hyperlipidemia, Diabetes, no history of                    smoking. Other Factors:     CTA of head and neck done 9/12 indicated right carotid                    bifurcation and proximal right cervical ICA_approximately 60%                    stenosis. Mild atherosclerosis at the left carotid                     bifurcation and nearly circumferential atherosclerotic plaque                    along the proximal left cervical ICA_50% stenosis. Limitations        Today's exam was limited due to the high bifurcation of the                    carotid and the body habitus of the patient. Comparison Study:  No prior study on file Performing Technologist: Alberta Lis RVS  Examination Guidelines: A complete evaluation includes B-mode imaging, spectral Doppler, color Doppler, and power Doppler as needed of all accessible portions of each vessel. Bilateral testing is considered an integral part of a complete examination. Limited examinations for reoccurring indications may be performed as noted.  Right Carotid Findings: +----------+--------+--------+--------+----------------------+--------+           PSV cm/sEDV cm/sStenosisPlaque Description    Comments +----------+--------+--------+--------+----------------------+--------+ CCA Prox  98      8               heterogenous                   +----------+--------+--------+--------+----------------------+--------+  CCA Distal79      15              heterogenous                   +----------+--------+--------+--------+----------------------+--------+ ICA Prox  102     25      1-39%   irregular and calcific         +----------+--------+--------+--------+----------------------+--------+ ICA Mid   105     30                                             +----------+--------+--------+--------+----------------------+--------+ ICA Distal105     29                                             +----------+--------+--------+--------+----------------------+--------+ ECA       111     20                                             +----------+--------+--------+--------+----------------------+--------+ +----------+--------+-------+--------+-------------------+           PSV cm/sEDV cmsDescribeArm Pressure (mmHG)  +----------+--------+-------+--------+-------------------+ [id number]                                        +----------+--------+-------+--------+-------------------+ +---------+--------+--+--------+--+ VertebralPSV cm/s47EDV cm/s10 +---------+--------+--+--------+--+  Left Carotid Findings: +----------+--------+--------+--------+------------------+-------------------+           PSV cm/sEDV cm/sStenosisPlaque DescriptionComments            +----------+--------+--------+--------+------------------+-------------------+ CCA Prox  92      20                                                    +----------+--------+--------+--------+------------------+-------------------+ CCA Distal78      18                                                    +----------+--------+--------+--------+------------------+-------------------+ ICA Prox  63      15      1-39%                                         +----------+--------+--------+--------+------------------+-------------------+ ICA Mid   82      21                                tortuous            +----------+--------+--------+--------+------------------+-------------------+ ICA Distal  not well visualized +----------+--------+--------+--------+------------------+-------------------+ ECA       69      9                                                     +----------+--------+--------+--------+------------------+-------------------+ +----------+--------+--------+--------+-------------------+           PSV cm/sEDV cm/sDescribeArm Pressure (mmHG) +----------+--------+--------+--------+-------------------+ Subclavian105                                         +----------+--------+--------+--------+-------------------+ +---------+--------+--+--------+--+ VertebralPSV cm/s46EDV cm/s13 +---------+--------+--+--------+--+   Summary: Right Carotid: Velocities in the  right ICA are consistent with a 1-39% stenosis. Left Carotid: Velocities in the left ICA are consistent with a 1-39% stenosis. Vertebrals:  Bilateral vertebral arteries demonstrate antegrade flow. Subclavians: Normal flow hemodynamics were seen in bilateral subclavian              arteries. *See table(s) above for measurements and observations.  Electronically signed by Fonda Rim on 06/10/2024 at 9:15:20 AM.    Final    CT ANGIO HEAD NECK W WO CM Result Date: 06/08/2024 EXAM: CT HEAD WITHOUT CTA HEAD AND NECK WITH AND WITHOUT 06/08/2024 05:05:43 PM TECHNIQUE: CTA of the head and neck was performed with and without the administration of intravenous contrast. Noncontrast CT of the head with reconstructed 2-D images are also provided for review. Multiplanar 2D and/or 3D reformatted images are provided for review. Automated exposure control, iterative reconstruction, and/or weight based adjustment of the mA/kV was utilized to reduce the radiation dose to as low as reasonably achievable. COMPARISON: Same day CT head and MRI head CLINICAL HISTORY: Stroke dysarthria. FINDINGS: CT HEAD: BRAIN AND VENTRICLES: No acute intracranial hemorrhage. No mass effect. No midline shift. No extra-axial fluid collection. No evidence of acute infarct. No hydrocephalus. ORBITS: No acute abnormality. SINUSES AND MASTOIDS: No acute abnormality. CTA NECK: AORTIC ARCH AND ARCH VESSELS: Mild atherosclerosis of the aortic arch. CERVICAL CAROTID ARTERIES: The right carotid artery is patent from the origin to the skull base. Atherosclerosis of the distal right common carotid artery resulting in mild stenosis. Additional atherosclerosis at the carotid bifurcation and proximal right cervical ICA resulting in approximately 60% stenosis of the proximal right cervical ICA. The left carotid artery is patent from the origin to the skull base. Mild atherosclerosis at the carotid bifurcation. There is additional nearly circumferential atherosclerotic  plaque along the proximal left cervical ICA. There is approximately 50% stenosis of the proximal cervical ICA just proximal to this point of maximal narrowing. 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 The vessels course through the skull base through separate canals and then join at the posterior genu of the petrous ICA. CERVICAL VERTEBRAL ARTERIES: The vertebral arteries are patent from the origins to the vertebrobasilar confluence. Tortuosity of the left V1 segment. Atherosclerosis of the left V4 segment resulting in mild stenosis. LUNGS AND MEDIASTINUM: Unremarkable. SOFT TISSUES: No acute abnormality. BONES: Degenerative changes in the visualized spine. Prominent disc osteophyte complex is most pronounced at C5-6, resulting in at least moderate spinal canal stenosis. CTA HEAD: ANTERIOR CIRCULATION: The intracranial internal carotid arteries are patent bilaterally. Mild atherosclerosis of the carotid siphons without hemodynamically significant stenosis. There is irregularity and moderate narrowing of  a proximal M2 branch of the left MCA. The middle cerebral arteries are patent bilaterally. Hypoplastic right A1 segment. The anterior cerebral arteries are patent. There is moderate-to-severe long segment narrowing of the right ACA from the distal A2 segment to the proximal A4 segment. POSTERIOR CIRCULATION: The posterior cerebral arteries are patent bilaterally, with irregularity and moderate narrowing of the anterior P2 segment of the left PCA. OTHER: No dural venous sinus thrombosis on this non-dedicated study. IMPRESSION: 1. No large vessel occlusion. 2. Irregularity and moderate narrowing of a proximal M2 branch of the left MCA. 3. Moderate-to-severe long segment narrowing of the right ACA from the distal A2 segment to the proximal A4 segment. 4. Irregularity and moderate narrowing of the anterior P2 segment of the left PCA. 5. Atherosclerosis at the right carotid  bifurcation and proximal right cervical ICA resulting in approximately 60% stenosis. 6. Mild atherosclerosis at the left carotid bifurcation and nearly circumferential atherosclerotic plaque along the proximal left cervical ICA resulting in approximately 50% stenosis. 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 Electronically signed by: Donnice Mania MD 06/08/2024 05:45 PM EDT RP Workstation: [id number]   DG Chest 2 View Result Date: 06/08/2024 CLINICAL DATA:  cough EXAM: CHEST - 2 VIEW COMPARISON:  September 25, 2009 FINDINGS: No focal airspace consolidation, pleural effusion, or pneumothorax. No cardiomegaly. Tortuous aorta with aortic atherosclerosis. No acute fracture or destructive lesions. Multilevel thoracic osteophytosis. Bilateral AC joint osteoarthritis. IMPRESSION: No acute cardiopulmonary abnormality. Electronically Signed   By: Rogelia Myers M.D.   On: 06/08/2024 16:26   MR BRAIN WO CONTRAST Result Date: 06/08/2024 EXAM: MRI BRAIN WITHOUT CONTRAST 06/08/2024 03:58:53 PM TECHNIQUE: Multiplanar multisequence MRI of the head/brain was performed without the administration of intravenous contrast. COMPARISON: None available. CLINICAL HISTORY: Dysarthria, right facial droop. Patient arrived POV caox4 complaining of slurred speech stating her speech has been abnormal since yesterday but thought it would just get better. Last known well Wednesday night. Denies numbness/weakness in extremities. Denies any recent trauma to the head. FINDINGS: BRAIN AND VENTRICLES: Acute nonhemorrhagic infarct in the left lentiform nucleus and corona radiata measures up to 11 mm on the coronal diffusion images. T2 and FLAIR signal is associated. Periventricular and subcortical T2 hyperintensities are mildly advanced for age, worse on the left. More remote lacunar infarcts are present in the left corona radiata. No intracranial hemorrhage. No mass. No midline shift. No hydrocephalus. The sella is  unremarkable. Normal flow voids. ORBITS: No acute abnormality. SINUSES AND MASTOIDS: Mild mucosal thickening is present in the left maxillary sinus and ethmoid air cells. Small mastoid effusions are present. BONES AND SOFT TISSUES: Normal marrow signal. No acute soft tissue abnormality. IMPRESSION: 1. Acute nonhemorrhagic infarct in the left lentiform nucleus and corona radiata, measuring up to 11 mm. 2. Mildly advanced periventricular and subcortical T2 hyperintensities for age, worse on the left. 3. More remote lacunar infarcts in the left corona radiata. Cortical values were called to Dr. Yolande at 4:25 pm. Electronically signed by: Lonni Necessary MD 06/08/2024 04:26 PM EDT RP Workstation: [id number]   CT HEAD WO CONTRAST Result Date: 06/08/2024 EXAM: CT HEAD WITHOUT CONTRAST 06/08/2024 03:15:00 PM TECHNIQUE: CT of the head was performed without the administration of intravenous contrast. Automated exposure control, iterative reconstruction, and/or weight based adjustment of the mA/kV was utilized to reduce the radiation dose to as low as reasonably achievable. COMPARISON: None available. CLINICAL HISTORY: Neuro deficit, acute, stroke suspected. Pt states been sick (cough/running nose), the slurred speech seemed  to start yesterday at some point-pt could not say. Noted right sided facial droop and drooling-patient willing/able to stand and said she did not feel any different but is unable to talk the same. FINDINGS: BRAIN AND VENTRICLES: No acute hemorrhage. No evidence of acute infarct. No hydrocephalus. No extra-axial collection. No mass effect or midline shift. Mild atrophy is within normal limits for age. ORBITS: No acute abnormality. SINUSES: Mild mucosal thickening is present within the left maxillary sinus and ethmoid air cells. SOFT TISSUES AND SKULL: No acute soft tissue abnormality. No skull fracture. IMPRESSION: 1. No acute intracranial abnormality. 2. Mild mucosal thickening in the left  maxillary sinus and ethmoid air cells. Electronically signed by: Lonni Necessary MD 06/08/2024 03:25 PM EDT RP Workstation: [id number]    Micro Results    Recent Results (from the past 240 hours)  Resp panel by RT-PCR (RSV, Flu A&B, Covid) Anterior Nasal Swab     Status: None   Collection Time: 06/08/24  4:07 PM   Specimen: Anterior Nasal Swab  Result Value Ref Range Status   SARS Coronavirus 2 by RT PCR NEGATIVE NEGATIVE Final    Comment: (NOTE) SARS-CoV-2 target nucleic acids are NOT DETECTED.  The SARS-CoV-2 RNA is generally detectable in upper respiratory specimens during the acute phase of infection. The lowest concentration of SARS-CoV-2 viral copies this assay can detect is 138 copies/mL. A negative result does not preclude SARS-Cov-2 infection and should not be used as the sole basis for treatment or other patient management decisions. A negative result may occur with  improper specimen collection/handling, submission of specimen other than nasopharyngeal swab, presence of viral mutation(s) within the areas targeted by this assay, and inadequate number of viral copies(<138 copies/mL). A negative result must be combined with clinical observations, patient history, and epidemiological information. The expected result is Negative.  Fact Sheet for Patients:  BloggerCourse.com  Fact Sheet for Healthcare Providers:  SeriousBroker.it  This test is no t yet approved or cleared by the [place]  FDA and  has been authorized for detection and/or diagnosis of SARS-CoV-2 by FDA under an Emergency Use Authorization (EUA). This EUA will remain  in effect (meaning this test can be used) for the duration of the COVID-19 declaration under Section 564(b)(1) of the Act, 21 U.S.C.section 360bbb-3(b)(1), unless the authorization is terminated  or revoked sooner.       Influenza A by PCR NEGATIVE NEGATIVE Final   Influenza B by  PCR NEGATIVE NEGATIVE Final    Comment: (NOTE) The Xpert Xpress SARS-CoV-2/FLU/RSV plus assay is intended as an aid in the diagnosis of influenza from Nasopharyngeal swab specimens and should not be used as a sole basis for treatment. Nasal washings and aspirates are unacceptable for Xpert Xpress SARS-CoV-2/FLU/RSV testing.  Fact Sheet for Patients: BloggerCourse.com  Fact Sheet for Healthcare Providers: SeriousBroker.it  This test is not yet approved or cleared by the [place]  FDA and has been authorized for detection and/or diagnosis of SARS-CoV-2 by FDA under an Emergency Use Authorization (EUA). This EUA will remain in effect (meaning this test can be used) for the duration of the COVID-19 declaration under Section 564(b)(1) of the Act, 21 U.S.C. section 360bbb-3(b)(1), unless the authorization is terminated or revoked.     Resp Syncytial Virus by PCR NEGATIVE NEGATIVE Final    Comment: (NOTE) Fact Sheet for Patients: BloggerCourse.com  Fact Sheet for Healthcare Providers: SeriousBroker.it  This test is not yet approved or cleared by the [place]  FDA and has been authorized for  detection and/or diagnosis of SARS-CoV-2 by FDA under an Emergency Use Authorization (EUA). This EUA will remain in effect (meaning this test can be used) for the duration of the COVID-19 declaration under Section 564(b)(1) of the Act, 21 U.S.C. section 360bbb-3(b)(1), unless the authorization is terminated or revoked.  Performed at [facility], [address], [place], [place] [address]     Today   Subjective    Hailey Johnson today has no headache,no chest abdominal pain,no new weakness tingling or numbness, feels much better wants to go home today.    Objective   Blood pressure (!) 143/59, pulse 78, temperature 97.9 F (36.6 C), temperature source Oral,  resp. rate 20, height 5' 4 (1.626 m), weight 97.8 kg, SpO2 99%.  No intake or output data in the 24 hours ending 06/11/24 0845  Exam  Awake Alert, right-sided facial droop and mild dysarthria [place].AT,PERRAL Supple Neck,   Symmetrical Chest wall movement, Good air movement bilaterally, CTAB RRR,No Gallops,   +ve B.Sounds, Abd Soft, Non tender,  No Cyanosis, Clubbing or edema    Data Review   Recent Labs  Lab 06/08/24 1451 06/09/24 0353  WBC 11.0* 12.0*  HGB 12.2 11.2*  HCT 35.4* 33.5*  PLT 379 357  MCV 87.2 87.2  MCH 30.0 29.2  MCHC 34.5 33.4  RDW 12.6 12.5  LYMPHSABS 2.6  --   MONOABS 0.7  --   EOSABS 0.5  --   BASOSABS 0.1  --     Recent Labs  Lab 06/08/24 1451 06/09/24 0353  NA 134*  --   K 4.4  --   CL 98  --   CO2 19*  --   ANIONGAP 17*  --   GLUCOSE 232*  --   BUN 14  --   CREATININE 0.92 0.97  AST 16  --   ALT 13  --   ALKPHOS 115  --   BILITOT 0.3  --   ALBUMIN 4.1  --   INR 1.0  --   HGBA1C  --  8.0*  CALCIUM  9.9  --     Total Time in preparing paper work, data evaluation and todays exam - 35 minutes  Signature  -    Lavada Stank M.D on 06/11/2024 at 8:45 AM   -  To page go to [email or web address]

## 2024-06-11 NOTE — Plan of Care (Signed)
  Problem: Education: Goal: Knowledge of General Education information will improve Description: Including pain rating scale, medication(s)/side effects and non-pharmacologic comfort measures Outcome: Progressing   Problem: Health Behavior/Discharge Planning: Goal: Ability to manage health-related needs will improve Outcome: Progressing   Problem: Clinical Measurements: Goal: Ability to maintain clinical measurements within normal limits will improve Outcome: Progressing Goal: Will remain free from infection Outcome: Progressing Goal: Diagnostic test results will improve Outcome: Progressing Goal: Respiratory complications will improve Outcome: Progressing Goal: Cardiovascular complication will be avoided Outcome: Progressing   Problem: Activity: Goal: Risk for activity intolerance will decrease Outcome: Progressing   Problem: Nutrition: Goal: Adequate nutrition will be maintained Outcome: Progressing   Problem: Coping: Goal: Level of anxiety will decrease Outcome: Progressing   Problem: Elimination: Goal: Will not experience complications related to bowel motility Outcome: Progressing Goal: Will not experience complications related to urinary retention Outcome: Progressing   Problem: Pain Managment: Goal: General experience of comfort will improve and/or be controlled Outcome: Progressing   Problem: Safety: Goal: Ability to remain free from injury will improve Outcome: Progressing   Problem: Skin Integrity: Goal: Risk for impaired skin integrity will decrease Outcome: Progressing   Problem: Education: Goal: Knowledge of disease or condition will improve Outcome: Progressing Goal: Knowledge of secondary prevention will improve (MUST DOCUMENT ALL) Outcome: Progressing Goal: Knowledge of patient specific risk factors will improve (DELETE if not current risk factor) Outcome: Progressing   Problem: Ischemic Stroke/TIA Tissue Perfusion: Goal: Complications of  ischemic stroke/TIA will be minimized Outcome: Progressing   Problem: Coping: Goal: Will verbalize positive feelings about self Outcome: Progressing Goal: Will identify appropriate support needs Outcome: Progressing   Problem: Health Behavior/Discharge Planning: Goal: Ability to manage health-related needs will improve Outcome: Progressing Goal: Goals will be collaboratively established with patient/family Outcome: Progressing   Problem: Self-Care: Goal: Ability to participate in self-care as condition permits will improve Outcome: Progressing Goal: Verbalization of feelings and concerns over difficulty with self-care will improve Outcome: Progressing Goal: Ability to communicate needs accurately will improve Outcome: Progressing   Problem: Nutrition: Goal: Risk of aspiration will decrease Outcome: Progressing Goal: Dietary intake will improve Outcome: Progressing   Problem: Education: Goal: Ability to describe self-care measures that may prevent or decrease complications (Diabetes Survival Skills Education) will improve Outcome: Progressing Goal: Individualized Educational Video(s) Outcome: Progressing   Problem: Coping: Goal: Ability to adjust to condition or change in health will improve Outcome: Progressing   Problem: Fluid Volume: Goal: Ability to maintain a balanced intake and output will improve Outcome: Progressing   Problem: Health Behavior/Discharge Planning: Goal: Ability to identify and utilize available resources and services will improve Outcome: Progressing Goal: Ability to manage health-related needs will improve Outcome: Progressing   Problem: Metabolic: Goal: Ability to maintain appropriate glucose levels will improve Outcome: Progressing   Problem: Nutritional: Goal: Maintenance of adequate nutrition will improve Outcome: Progressing Goal: Progress toward achieving an optimal weight will improve Outcome: Progressing   Problem: Skin  Integrity: Goal: Risk for impaired skin integrity will decrease Outcome: Progressing   Problem: Tissue Perfusion: Goal: Adequacy of tissue perfusion will improve Outcome: Progressing   Problem: Clinical Measurements: Goal: Ability to maintain clinical measurements within normal limits will improve Outcome: Progressing

## 2024-06-11 NOTE — TOC Progression Note (Signed)
 Transition of Care [facility]) - Progression Note    Patient Details  Name: Hailey Johnson MRN: [id number] Date of Birth: Nov 10, 1945  Transition of Care [facility] CM/SW Contact  Nola Devere Hands, RN Phone Number: 06/11/2024, 9:09 AM  Clinical Narrative:    Case manager spoke with patient concerning recommendations for Home Health therapies, PT/OT an Speech. Patient is in agreement, gave permission for CM to arrange with agency that will accept her [employer]. Referral called to Darleene Gowda, [facility] Liaison. Patient will have family support at discharge.    Expected Discharge Plan: Home w Home Health Services Barriers to Discharge: No Barriers Identified               Expected Discharge Plan and Services In-house Referral: NA Discharge Planning Services: CM Consult Post Acute Care Choice: Home Health Living arrangements for the past 2 months: Single Family Home Expected Discharge Date: 06/11/24               DME Arranged: N/A DME Agency: NA       HH Arranged: PT, OT, Speech Therapy HH Agency: [facility] Date [place] [facility] Contacted: 06/11/24 Time HH Agency Contacted: 0900 Representative spoke with at [facility] Agency: Darleene Gowda   Social Drivers of Health (SDOH) Interventions SDOH Screenings   Food Insecurity: No Food Insecurity (06/08/2024)  Housing: Low Risk  (06/08/2024)  Transportation Needs: No Transportation Needs (06/08/2024)  Utilities: Not At Risk (06/08/2024)  Social Connections: Socially Integrated (06/08/2024)  Tobacco Use: Low Risk  (06/08/2024)    Readmission Risk Interventions     No data to display

## 2024-06-11 NOTE — Telephone Encounter (Signed)
 Patient Product/process development scientist completed.    The patient is insured through [employer]/ [employer]. Patient has Medicare and is not eligible for a copay card, but may be able to apply for patient assistance or Medicare RX Payment Plan (Patient Must reach out to their plan, if eligible for payment plan), if available.    Ran test claim for Lantus  Pen and the current 30 day co-pay is $35.00.  Ran test claim for Novolog  FlexPen and the current 30 day co-pay is $35.00.  Ran test claim for Freestyle Libre 3 Plus Sensors and the current 30 day co-pay is $0.00.  Ran test claim for [employer] and Requires Prior Authorization  This test claim was processed through [employer]- copay amounts may vary at other pharmacies due to [employer], or as the patient moves through the different stages of their insurance plan.     Reyes Sharps, CPHT Pharmacy Technician III Certified Patient Advocate [facility] Patient Advocate Team Direct Number: [phone]  Fax: [phone]

## 2024-06-11 NOTE — Inpatient Diabetes Management (Signed)
 Inpatient Diabetes Program Recommendations  AACE/ADA: New Consensus Statement on Inpatient Glycemic Control  Target Ranges:  Prepandial:   less than 140 mg/dL      Peak postprandial:   less than 180 mg/dL (1-2 hours)      Critically ill patients:  140 - 180 mg/dL    Review of Glycemic Control  Diabetes history: DM2 Outpatient Diabetes medications: Glipizide  Current orders for Inpatient glycemic control: Glipizide 10mg  daily and Janumet 50-500 two tablets BID.   Inpatient Diabetes Program Recommendations: Received page from Dr Dennise stating patient will be discharged today on insulin  and needs education. Met with patient, patient's husband and daughter at bedside. Educated patient, spouse, and daughter on insulin  pen use at home. Reviewed all steps of insulin  pen including attachment of needle, 2-unit air shot, dialing up dose, giving injection, removing needle, disposal of sharps, storage of unused insulin , disposal of insulin  etc. Patient able to provide successful return demonstration. Also reviewed troubleshooting with insulin  pen. MD to give patient Rxs for insulin  pens and insulin  pen needles. Educated patient on the difference between long-acting insulin  and short-acting insulin  and the importance of giving them are particular times of the day (long acting ONCE per day & Short acting TID before meals using sliding scale that will be provided at discharge). Patient verbalized understanding.  Explained to patient and family members that hypoglycemia is defined in as blood glucose less than 70 mg/dL. Causes of hypoglycemia can be too much insulin , physical activity, diarrhea, vomiting. Signs/symptoms of hypoglycemia are feeling sweaty, shaky, dizzy. Provided family with expectation that hypoglycemia management is common. Reviewed Rule of 15-15 if blood glucose 60-70 mg/dL and Rule of 69-84 if blood glucose <60 mg/dL. Stressed the importance of treating hypoglycemia with a simple/fast-acting  carbohydrate. Advised patient not to use chocolate or diet/sugar-free drinks to manage hypoglycemia. Reviewed examples with family until they could demonstrate understanding and provided them with written materials on hypoglycemia.  Per Dr Dennise, patient can initiate CGM using a Libre3 sensor. Confirmed with [facility] this is covered by insurance with $0 co-pay. Provided pt with 2 samples (1 month supply) until patient can get prescription form her PCP. Educated patient on FreeStyle Libre3 CGM regarding application and changing CGM sensor (alternate every 14 days on back of arms), 1 hour warm-up, how to scan sensor to start a new sensor, and how to use app to check glucose. Patient has been given Freestyle Libre3 sensor sample. Provided educational packet regarding FreeStyle Libre3 CGM. Patient plans to use Android FreeStyle Libre3 app to read FreeStyle Libre sensor and was able to download the app and create an account. Informed patient that it would be requested that attending provider provide Rx for first month of FreeStyle Libre3 sensors and that she have PCP continue to provide Rx for FreeStyle Libre3 sensors going forward. Asked patient to be sure to let PCP know about Liane and allow provider to review reports from [place] app so the provider can use the information to continue to make adjustments with DM medications if needed. Patient verbalized understanding of information and has no questions at this time.   Thanks,  Lavanda Search, RN, MSN, [facility]  Inpatient Diabetes Coordinator  Pager [phone] (8a-5p)

## 2024-06-13 DIAGNOSIS — Z794 Long term (current) use of insulin: Secondary | ICD-10-CM | POA: Diagnosis not present

## 2024-06-13 DIAGNOSIS — I69351 Hemiplegia and hemiparesis following cerebral infarction affecting right dominant side: Secondary | ICD-10-CM | POA: Diagnosis not present

## 2024-06-13 DIAGNOSIS — Z9181 History of falling: Secondary | ICD-10-CM | POA: Diagnosis not present

## 2024-06-13 DIAGNOSIS — I69320 Aphasia following cerebral infarction: Secondary | ICD-10-CM | POA: Diagnosis not present

## 2024-06-13 DIAGNOSIS — E785 Hyperlipidemia, unspecified: Secondary | ICD-10-CM | POA: Diagnosis not present

## 2024-06-13 DIAGNOSIS — I69391 Dysphagia following cerebral infarction: Secondary | ICD-10-CM | POA: Diagnosis not present

## 2024-06-13 DIAGNOSIS — Z7982 Long term (current) use of aspirin: Secondary | ICD-10-CM | POA: Diagnosis not present

## 2024-06-13 DIAGNOSIS — E669 Obesity, unspecified: Secondary | ICD-10-CM | POA: Diagnosis not present

## 2024-06-13 DIAGNOSIS — Z7984 Long term (current) use of oral hypoglycemic drugs: Secondary | ICD-10-CM | POA: Diagnosis not present

## 2024-06-13 DIAGNOSIS — D72829 Elevated white blood cell count, unspecified: Secondary | ICD-10-CM | POA: Diagnosis not present

## 2024-06-13 DIAGNOSIS — I69322 Dysarthria following cerebral infarction: Secondary | ICD-10-CM | POA: Diagnosis not present

## 2024-06-13 DIAGNOSIS — I088 Other rheumatic multiple valve diseases: Secondary | ICD-10-CM | POA: Diagnosis not present

## 2024-06-13 DIAGNOSIS — Z6836 Body mass index (BMI) 36.0-36.9, adult: Secondary | ICD-10-CM | POA: Diagnosis not present

## 2024-06-13 DIAGNOSIS — E119 Type 2 diabetes mellitus without complications: Secondary | ICD-10-CM | POA: Diagnosis not present

## 2024-06-13 DIAGNOSIS — Z7902 Long term (current) use of antithrombotics/antiplatelets: Secondary | ICD-10-CM | POA: Diagnosis not present

## 2024-06-13 DIAGNOSIS — I119 Hypertensive heart disease without heart failure: Secondary | ICD-10-CM | POA: Diagnosis not present

## 2024-06-14 DIAGNOSIS — I6523 Occlusion and stenosis of bilateral carotid arteries: Secondary | ICD-10-CM | POA: Diagnosis not present

## 2024-06-14 DIAGNOSIS — E1169 Type 2 diabetes mellitus with other specified complication: Secondary | ICD-10-CM | POA: Diagnosis not present

## 2024-06-14 DIAGNOSIS — Z9289 Personal history of other medical treatment: Secondary | ICD-10-CM | POA: Diagnosis not present

## 2024-06-14 DIAGNOSIS — I10 Essential (primary) hypertension: Secondary | ICD-10-CM | POA: Diagnosis not present

## 2024-06-14 DIAGNOSIS — E785 Hyperlipidemia, unspecified: Secondary | ICD-10-CM | POA: Diagnosis not present

## 2024-06-14 DIAGNOSIS — Z8673 Personal history of transient ischemic attack (TIA), and cerebral infarction without residual deficits: Secondary | ICD-10-CM | POA: Diagnosis not present

## 2024-06-15 ENCOUNTER — Encounter (HOSPITAL_COMMUNITY): Payer: Self-pay | Admitting: Vascular Surgery

## 2024-06-15 NOTE — Anesthesia Preprocedure Evaluation (Addendum)
 Anesthesia Evaluation  Patient identified by MRN, date of birth, ID band Patient awake    Reviewed: Allergy & Precautions, NPO status , Patient's Chart, lab work & pertinent test results  History of Anesthesia Complications Negative for: history of anesthetic complications  Airway Mallampati: II  TM Distance: >3 FB Neck ROM: Full    Dental  (+) Dental Advisory Given   Pulmonary Recent URI    breath sounds clear to auscultation       Cardiovascular hypertension, Pt. on medications (-) angina (-) CAD and (-) CHF  Rhythm:Regular Rate:Normal     Neuro/Psych CVA, Residual Symptoms    GI/Hepatic Neg liver ROS,,,  Endo/Other  diabetes, Poorly Controlled, Type 2, Oral Hypoglycemic Agents    Renal/GU      Musculoskeletal   Abdominal   Peds  Hematology   Anesthesia Other Findings   Reproductive/Obstetrics                              Anesthesia Physical Anesthesia Plan  ASA: 3  Anesthesia Plan: General   Post-op Pain Management:    Induction: Intravenous  PONV Risk Score and Plan: 1 and Ondansetron  and Dexamethasone   Airway Management Planned: Oral ETT  Additional Equipment: Arterial line  Intra-op Plan:   Post-operative Plan: Extubation in OR  Informed Consent:      Dental advisory given  Plan Discussed with: CRNA and Surgeon  Anesthesia Plan Comments: (Anesthetic Hx: None to Review  TTE: LVEF 60-65%, normal RV function and size, no PFO, no valvular abnormalities  78 year old female with PMH of poorly controlled HTN, T2DM (A1c of 8.0), HLD, recent URI and 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 TTE as above. Labs reviewed - Hgb 11.2, Plts 357, Cr 0.97, glucose 209. Type and Screen ordered to be collected prior to surgery. Plan for GETA with arterial line, PIV x 2. Plan to use insulin  infusion intraoperatively due to poorly  controlled glucose. )         Anesthesia Quick Evaluation

## 2024-06-15 NOTE — Progress Notes (Signed)
 Anesthesia Chart Review: Same day workup  78 yo female with pertinent hx including HTN, HLD, IDDM2 (A1c 8.0 on 06/09/24).  Recent admission 9/12-9/15/25 for CVA (acute nonhemorrhagic infarct in the left lentiform nucleus and corona radiata). Evaluated by vascular surgery for ICA stenosis and outpatient TCAR recommended. Pt was also initiated on insulin  during admission for uncontrolled DM2. She was also discharged on DAPT plus statin.  Echo during admission showed LVEF 60-65%, grade 1 dd, normal RV, no significant valvular abnormalities.   BMP 06/08/24 reviewed, mild hyponatremia 134 that normalizes when accounting for glucose 232, otherwise unremarkable. CBC 06/10/23 reviewed, wbc mildly elevated 12.0, mild anemia Hgb 11.2, otherwise unremarkable.  Pt will need DOS evaluation.  CTA Head/Neck 06/08/24: IMPRESSION: 1. No large vessel occlusion. 2. Irregularity and moderate narrowing of a proximal M2 branch of the left MCA. 3. Moderate-to-severe long segment narrowing of the right ACA from the distal A2 segment to the proximal A4 segment. 4. Irregularity and moderate narrowing of the anterior P2 segment of the left PCA. 5. Atherosclerosis at the right carotid bifurcation and proximal right cervical ICA resulting in approximately 60% stenosis. 6. Mild atherosclerosis at the left carotid bifurcation and nearly circumferential atherosclerotic plaque along the proximal left cervical ICA resulting in approximately 50% stenosis. 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  TTE 06/10/24: 1. Left ventricular ejection fraction, by estimation, is 60 to 65%. The  left ventricle has normal function. The left ventricle has no regional  wall motion abnormalities. There is mild left ventricular hypertrophy.  Left ventricular diastolic parameters  are consistent with Grade I diastolic dysfunction (impaired relaxation).   2. Right ventricular systolic function is normal. The right  ventricular  size is normal. Tricuspid regurgitation signal is inadequate for assessing  PA pressure.   3. The mitral valve is normal in structure. Trivial mitral valve  regurgitation. No evidence of mitral stenosis.   4. The aortic valve is grossly normal. Aortic valve regurgitation is not  visualized. No aortic stenosis is present.   5. The inferior vena cava is normal in size with greater than 50%  respiratory variability, suggesting right atrial pressure of 3 mmHg.   Conclusion(s)/Recommendation(s): No intracardiac source of embolism  detected on this transthoracic study. Consider a transesophageal  echocardiogram to exclude cardiac source of embolism if clinically  indicated.    Lynwood Geofm RIGGERS [facility] Short Stay Center/Anesthesiology Phone [phone] 06/15/2024 8:43 AM

## 2024-06-15 NOTE — Progress Notes (Signed)
 SDW CALL  Patient was given pre-op instructions over the phone. The opportunity was given for the patient to ask questions. No further questions asked. Patient verbalized understanding of instructions given.   PCP - Dyane Anthony RAMAN, FNP  Cardiologist -   PPM/ICD - denies Device Orders - n/a Rep Notified - n/a  Chest x-ray - 06-08-24 EKG - DOS Stress Test -  ECHO -06-10-24  Cardiac Cath -   Sleep Study - denies CPAP - n/a  Fasting Blood Sugar - Per patient Blood sugars have been irregular.  Freestyle Libre left arm Last A1c 8.0 on 06-09-24  Last dose of GLP1 agonist-  denies GLP1 instructions: n/a  Blood Thinner Instructions:plavix  continue Aspirin  Instructions:continue  ERAS Protcol -NPO   COVID TEST- n/a   Anesthesia review: Yes recent stroke  Patient denies shortness of breath, fever, cough and chest pain over the phone call   All instructions explained to the patient, with a verbal understanding of the material. Patient agrees to go over the instructions while at home for a better understanding.

## 2024-06-18 ENCOUNTER — Encounter (HOSPITAL_COMMUNITY)
Admission: RE | Disposition: A | Discharge status: Home Health | Payer: Self-pay | Source: Home / Self Care | Attending: Vascular Surgery

## 2024-06-18 ENCOUNTER — Inpatient Hospital Stay (HOSPITAL_COMMUNITY): Payer: Self-pay | Admitting: Physician Assistant

## 2024-06-18 ENCOUNTER — Encounter (HOSPITAL_COMMUNITY): Payer: Self-pay | Admitting: Vascular Surgery

## 2024-06-18 ENCOUNTER — Other Ambulatory Visit: Payer: Self-pay

## 2024-06-18 ENCOUNTER — Inpatient Hospital Stay (HOSPITAL_COMMUNITY)
Admission: RE | Admit: 2024-06-18 | Discharge: 2024-06-19 | DRG: 036 | Disposition: A | Discharge status: Home Health | Attending: Vascular Surgery | Admitting: Vascular Surgery

## 2024-06-18 ENCOUNTER — Inpatient Hospital Stay (HOSPITAL_COMMUNITY)

## 2024-06-18 DIAGNOSIS — Z006 Encounter for examination for normal comparison and control in clinical research program: Secondary | ICD-10-CM | POA: Diagnosis not present

## 2024-06-18 DIAGNOSIS — Z888 Allergy status to other drugs, medicaments and biological substances status: Secondary | ICD-10-CM

## 2024-06-18 DIAGNOSIS — I6522 Occlusion and stenosis of left carotid artery: Principal | ICD-10-CM | POA: Diagnosis present

## 2024-06-18 DIAGNOSIS — E785 Hyperlipidemia, unspecified: Secondary | ICD-10-CM | POA: Diagnosis present

## 2024-06-18 DIAGNOSIS — E119 Type 2 diabetes mellitus without complications: Secondary | ICD-10-CM

## 2024-06-18 DIAGNOSIS — I10 Essential (primary) hypertension: Secondary | ICD-10-CM

## 2024-06-18 DIAGNOSIS — R4781 Slurred speech: Secondary | ICD-10-CM | POA: Diagnosis not present

## 2024-06-18 DIAGNOSIS — I639 Cerebral infarction, unspecified: Secondary | ICD-10-CM

## 2024-06-18 DIAGNOSIS — Z7984 Long term (current) use of oral hypoglycemic drugs: Secondary | ICD-10-CM

## 2024-06-18 DIAGNOSIS — Z8673 Personal history of transient ischemic attack (TIA), and cerebral infarction without residual deficits: Secondary | ICD-10-CM

## 2024-06-18 DIAGNOSIS — Z91040 Latex allergy status: Secondary | ICD-10-CM | POA: Diagnosis not present

## 2024-06-18 HISTORY — DX: Type 2 diabetes mellitus without complications: E11.9

## 2024-06-18 HISTORY — DX: Cerebral infarction, unspecified: I63.9

## 2024-06-18 HISTORY — PX: ULTRASOUND GUIDANCE FOR VASCULAR ACCESS: SHX6516

## 2024-06-18 HISTORY — PX: TRANSCAROTID ARTERY REVASCULARIZATIONÂ: SHX6778

## 2024-06-18 LAB — GLUCOSE, CAPILLARY
Glucose-Capillary: 211 mg/dL — ABNORMAL HIGH (ref 70–99)
Glucose-Capillary: 208 mg/dL — ABNORMAL HIGH (ref 70–99)
Glucose-Capillary: 237 mg/dL — ABNORMAL HIGH (ref 70–99)
Glucose-Capillary: 301 mg/dL — ABNORMAL HIGH (ref 70–99)
Glucose-Capillary: 346 mg/dL — ABNORMAL HIGH (ref 70–99)
Glucose-Capillary: 262 mg/dL — ABNORMAL HIGH (ref 70–99)

## 2024-06-18 LAB — URINALYSIS, ROUTINE W REFLEX MICROSCOPIC
Bilirubin Urine: NEGATIVE
Glucose, UA: NEGATIVE mg/dL
Hgb urine dipstick: NEGATIVE
Ketones, ur: NEGATIVE mg/dL
Nitrite: NEGATIVE
Protein, ur: NEGATIVE mg/dL
Specific Gravity, Urine: 1.021 (ref 1.005–1.030)
pH: 5 (ref 5.0–8.0)

## 2024-06-18 LAB — COMPREHENSIVE METABOLIC PANEL WITH GFR
ALT: 18 U/L (ref 0–44)
AST: 19 U/L (ref 15–41)
Albumin: 3.5 g/dL (ref 3.5–5.0)
Alkaline Phosphatase: 82 U/L (ref 38–126)
Anion gap: 14 (ref 5–15)
BUN: 24 mg/dL — ABNORMAL HIGH (ref 8–23)
CO2: 18 mmol/L — ABNORMAL LOW (ref 22–32)
Calcium: 9.2 mg/dL (ref 8.9–10.3)
Chloride: 102 mmol/L (ref 98–111)
Creatinine, Ser: 1.09 mg/dL — ABNORMAL HIGH (ref 0.44–1.00)
GFR, Estimated: 53 mL/min — ABNORMAL LOW (ref >60)
Glucose, Bld: 199 mg/dL — ABNORMAL HIGH (ref 70–99)
Potassium: 4.2 mmol/L (ref 3.5–5.1)
Sodium: 134 mmol/L — ABNORMAL LOW (ref 135–145)
Total Bilirubin: 0.4 mg/dL (ref 0.0–1.2)
Total Protein: 6.8 g/dL (ref 6.5–8.1)

## 2024-06-18 LAB — SURGICAL PCR SCREEN
MRSA, PCR: NEGATIVE
Staphylococcus aureus: NEGATIVE

## 2024-06-18 LAB — CBC
HCT: 36.8 % (ref 36.0–46.0)
Hemoglobin: 12.2 g/dL (ref 12.0–15.0)
MCH: 29.9 pg (ref 26.0–34.0)
MCHC: 33.2 g/dL (ref 30.0–36.0)
MCV: 90.2 fL (ref 80.0–100.0)
Platelets: 379 K/uL (ref 150–400)
RBC: 4.08 MIL/uL (ref 3.87–5.11)
RDW: 12.3 % (ref 11.5–15.5)
WBC: 14.7 K/uL — ABNORMAL HIGH (ref 4.0–10.5)
nRBC: 0 % (ref 0.0–0.2)

## 2024-06-18 LAB — ABO/RH: ABO/RH(D): O POS

## 2024-06-18 LAB — PROTIME-INR
INR: 1 (ref 0.8–1.2)
Prothrombin Time: 13.4 s (ref 11.4–15.2)

## 2024-06-18 LAB — APTT: aPTT: 27 s (ref 24–36)

## 2024-06-18 LAB — TYPE AND SCREEN
ABO/RH(D): O POS
Antibody Screen: NEGATIVE

## 2024-06-18 LAB — POCT ACTIVATED CLOTTING TIME: Activated Clotting Time: 245 s

## 2024-06-18 SURGERY — TRANSCAROTID ARTERY REVASCULARIZATION (TCAR)
Anesthesia: General | Site: Neck | Laterality: Left

## 2024-06-18 MED ORDER — ROCURONIUM BROMIDE 10 MG/ML (PF) SYRINGE
PREFILLED_SYRINGE | INTRAVENOUS | Status: AC
Start: 2024-06-18 — End: 2024-06-18
  Filled 2024-06-18: qty 20

## 2024-06-18 MED ORDER — PHENYLEPHRINE 80 MCG/ML (10ML) SYRINGE FOR IV PUSH (FOR BLOOD PRESSURE SUPPORT)
PREFILLED_SYRINGE | INTRAVENOUS | Status: DC | PRN
  Administered 2024-06-18: 160 ug via INTRAVENOUS
  Administered 2024-06-18: 40 ug via INTRAVENOUS

## 2024-06-18 MED ORDER — FENTANYL CITRATE (PF) 250 MCG/5ML IJ SOLN
INTRAMUSCULAR | Status: AC
  Filled 2024-06-18: qty 5

## 2024-06-18 MED ORDER — CHLORHEXIDINE GLUCONATE CLOTH 2 % EX PADS: 6.0000 | MEDICATED_PAD | Freq: Once | CUTANEOUS | Status: DC

## 2024-06-18 MED ORDER — INSULIN ASPART 100 UNIT/ML IJ SOLN: 0.0000 [IU] | INTRAMUSCULAR | Status: DC | PRN

## 2024-06-18 MED ORDER — HEPARIN SODIUM (PORCINE) 1000 UNIT/ML IJ SOLN
INTRAMUSCULAR | Status: AC
  Filled 2024-06-18: qty 30

## 2024-06-18 MED ORDER — SUGAMMADEX SODIUM 200 MG/2ML IV SOLN
INTRAVENOUS | Status: DC | PRN
  Administered 2024-06-18: 388.4 mg via INTRAVENOUS

## 2024-06-18 MED ORDER — ONDANSETRON HCL 4 MG/2ML IJ SOLN: 4.0000 mg | Freq: Once | INTRAMUSCULAR | Status: DC | PRN

## 2024-06-18 MED ORDER — EPHEDRINE SULFATE-NACL 50-0.9 MG/10ML-% IV SOSY
PREFILLED_SYRINGE | INTRAVENOUS | Status: DC | PRN
  Administered 2024-06-18: 5 mg via INTRAVENOUS

## 2024-06-18 MED ORDER — ROSUVASTATIN CALCIUM 20 MG PO TABS
20.0000 mg | ORAL_TABLET | Freq: Every day | ORAL | Status: DC
  Administered 2024-06-19: 20 mg via ORAL
  Filled 2024-06-18: qty 1

## 2024-06-18 MED ORDER — METOPROLOL TARTRATE 5 MG/5ML IV SOLN: 2.5000 mg | INTRAVENOUS | Status: DC | PRN

## 2024-06-18 MED ORDER — HEMOSTATIC AGENTS (NO CHARGE) OPTIME
TOPICAL | Status: DC | PRN
  Administered 2024-06-18: 1 via TOPICAL

## 2024-06-18 MED ORDER — ROCURONIUM BROMIDE 10 MG/ML (PF) SYRINGE
PREFILLED_SYRINGE | INTRAVENOUS | Status: DC | PRN
  Administered 2024-06-18: 60 mg via INTRAVENOUS

## 2024-06-18 MED ORDER — POLYETHYLENE GLYCOL 3350 17 G PO PACK: 17.0000 g | PACK | Freq: Every day | ORAL | Status: DC | PRN

## 2024-06-18 MED ORDER — PROTAMINE SULFATE 10 MG/ML IV SOLN
INTRAVENOUS | Status: DC | PRN
  Administered 2024-06-18: 10 mg via INTRAVENOUS
  Administered 2024-06-18: 40 mg via INTRAVENOUS

## 2024-06-18 MED ORDER — LINAGLIPTIN 5 MG PO TABS
5.0000 mg | ORAL_TABLET | Freq: Every day | ORAL | Status: DC
  Administered 2024-06-18: 5 mg via ORAL
  Filled 2024-06-18: qty 1

## 2024-06-18 MED ORDER — HEPARIN 6000 UNIT IRRIGATION SOLUTION
Status: AC
  Filled 2024-06-18: qty 500

## 2024-06-18 MED ORDER — CEFAZOLIN SODIUM-DEXTROSE 2-4 GM/100ML-% IV SOLN
2.0000 g | Freq: Three times a day (TID) | INTRAVENOUS | Status: AC
  Administered 2024-06-18 – 2024-06-19 (×2): 2 g via INTRAVENOUS
  Filled 2024-06-18 (×2): qty 100

## 2024-06-18 MED ORDER — ACETAMINOPHEN 650 MG RE SUPP: 325.0000 mg | RECTAL | Status: DC | PRN

## 2024-06-18 MED ORDER — INSULIN ASPART 100 UNIT/ML FLEXPEN
2.0000 [IU] | PEN_INJECTOR | Freq: Three times a day (TID) | SUBCUTANEOUS | Status: DC
Start: 2024-06-18 — End: 2024-06-18
  Filled 2024-06-18: qty 3

## 2024-06-18 MED ORDER — LABETALOL HCL 5 MG/ML IV SOLN: 10.0000 mg | INTRAVENOUS | Status: DC | PRN

## 2024-06-18 MED ORDER — DOCUSATE SODIUM 100 MG PO CAPS
100.0000 mg | ORAL_CAPSULE | Freq: Every day | ORAL | Status: DC
Start: 2024-06-19 — End: 2024-06-19
  Filled 2024-06-18: qty 1

## 2024-06-18 MED ORDER — LIDOCAINE HCL (PF) 1 % IJ SOLN
INTRAMUSCULAR | Status: AC
  Filled 2024-06-18: qty 5

## 2024-06-18 MED ORDER — PHENYLEPHRINE HCL-NACL 20-0.9 MG/250ML-% IV SOLN
INTRAVENOUS | Status: DC | PRN
  Administered 2024-06-18: 50 ug/min via INTRAVENOUS

## 2024-06-18 MED ORDER — HEPARIN SODIUM (PORCINE) 1000 UNIT/ML IJ SOLN
INTRAMUSCULAR | Status: DC | PRN
  Administered 2024-06-18: 9000 [IU] via INTRAVENOUS
  Administered 2024-06-18: 2000 [IU] via INTRAVENOUS

## 2024-06-18 MED ORDER — FENTANYL CITRATE (PF) 250 MCG/5ML IJ SOLN
INTRAMUSCULAR | Status: DC | PRN
  Administered 2024-06-18: 100 ug via INTRAVENOUS

## 2024-06-18 MED ORDER — GLYCOPYRROLATE PF 0.2 MG/ML IJ SOSY
PREFILLED_SYRINGE | INTRAMUSCULAR | Status: AC
  Filled 2024-06-18: qty 2

## 2024-06-18 MED ORDER — ORAL CARE MOUTH RINSE: 15.0000 mL | Freq: Once | OROMUCOSAL | Status: AC

## 2024-06-18 MED ORDER — DROPERIDOL 2.5 MG/ML IJ SOLN: 0.6250 mg | Freq: Once | INTRAMUSCULAR | Status: DC | PRN

## 2024-06-18 MED ORDER — LACTATED RINGERS IV SOLN: INTRAVENOUS | Status: DC

## 2024-06-18 MED ORDER — PROPOFOL 10 MG/ML IV BOLUS
INTRAVENOUS | Status: AC
  Filled 2024-06-18: qty 20

## 2024-06-18 MED ORDER — FENTANYL CITRATE (PF) 100 MCG/2ML IJ SOLN: 25.0000 ug | INTRAMUSCULAR | Status: DC | PRN

## 2024-06-18 MED ORDER — ACETAMINOPHEN 10 MG/ML IV SOLN: 1000.0000 mg | Freq: Once | INTRAVENOUS | Status: DC | PRN

## 2024-06-18 MED ORDER — POTASSIUM CHLORIDE CRYS ER 20 MEQ PO TBCR: 40.0000 meq | EXTENDED_RELEASE_TABLET | Freq: Every day | ORAL | Status: DC | PRN

## 2024-06-18 MED ORDER — IODIXANOL 320 MG/ML IV SOLN
INTRAVENOUS | Status: DC | PRN
  Administered 2024-06-18: 36 mL via INTRA_ARTERIAL

## 2024-06-18 MED ORDER — ONDANSETRON HCL 4 MG/2ML IJ SOLN: 4.0000 mg | Freq: Four times a day (QID) | INTRAMUSCULAR | Status: DC | PRN

## 2024-06-18 MED ORDER — HEPARIN SODIUM (PORCINE) 5000 UNIT/ML IJ SOLN
5000.0000 [IU] | Freq: Three times a day (TID) | INTRAMUSCULAR | Status: DC
  Administered 2024-06-19: 5000 [IU] via SUBCUTANEOUS
  Filled 2024-06-18: qty 1

## 2024-06-18 MED ORDER — HEPARIN 6000 UNIT IRRIGATION SOLUTION
Status: DC | PRN
  Administered 2024-06-18: 1

## 2024-06-18 MED ORDER — ONDANSETRON HCL 4 MG/2ML IJ SOLN
INTRAMUSCULAR | Status: AC
  Filled 2024-06-18: qty 4

## 2024-06-18 MED ORDER — ACETAMINOPHEN 325 MG PO TABS: 325.0000 mg | ORAL_TABLET | ORAL | Status: DC | PRN

## 2024-06-18 MED ORDER — INSULIN GLARGINE 100 UNIT/ML ~~LOC~~ SOLN
18.0000 [IU] | Freq: Every day | SUBCUTANEOUS | Status: DC
  Administered 2024-06-18 – 2024-06-19 (×2): 18 [IU] via SUBCUTANEOUS
  Filled 2024-06-18 (×2): qty 0.18

## 2024-06-18 MED ORDER — DEXAMETHASONE SODIUM PHOSPHATE 10 MG/ML IJ SOLN
INTRAMUSCULAR | Status: DC | PRN
  Administered 2024-06-18: 5 mg via INTRAVENOUS

## 2024-06-18 MED ORDER — CEFAZOLIN SODIUM-DEXTROSE 2-4 GM/100ML-% IV SOLN
2.0000 g | INTRAVENOUS | Status: AC
  Administered 2024-06-18: 2 g via INTRAVENOUS
  Filled 2024-06-18: qty 100

## 2024-06-18 MED ORDER — EPHEDRINE 5 MG/ML INJ
INTRAVENOUS | Status: AC
  Filled 2024-06-18: qty 10

## 2024-06-18 MED ORDER — INSULIN ASPART 100 UNIT/ML IJ SOLN
2.0000 [IU] | Freq: Three times a day (TID) | INTRAMUSCULAR | Status: DC
  Administered 2024-06-18: 8 [IU] via SUBCUTANEOUS
  Administered 2024-06-19: 2 [IU] via SUBCUTANEOUS

## 2024-06-18 MED ORDER — CLEVIDIPINE BUTYRATE 0.5 MG/ML IV EMUL
INTRAVENOUS | Status: DC | PRN
  Administered 2024-06-18: 2 mg/h via INTRAVENOUS

## 2024-06-18 MED ORDER — PHENOL 1.4 % MT LIQD: 1.0000 | OROMUCOSAL | Status: DC | PRN

## 2024-06-18 MED ORDER — HEPARIN 6000 UNIT IRRIGATION SOLUTION
Status: AC
Start: 2024-06-18 — End: 2024-06-18
  Filled 2024-06-18: qty 500

## 2024-06-18 MED ORDER — DEXAMETHASONE SODIUM PHOSPHATE 10 MG/ML IJ SOLN
INTRAMUSCULAR | Status: AC
  Filled 2024-06-18: qty 2

## 2024-06-18 MED ORDER — ASPIRIN 81 MG PO TBEC
81.0000 mg | DELAYED_RELEASE_TABLET | Freq: Once | ORAL | Status: AC
  Administered 2024-06-18: 81 mg via ORAL
  Filled 2024-06-18: qty 1

## 2024-06-18 MED ORDER — HYDRALAZINE HCL 20 MG/ML IJ SOLN: 5.0000 mg | INTRAMUSCULAR | Status: DC | PRN

## 2024-06-18 MED ORDER — PROPOFOL 10 MG/ML IV BOLUS
INTRAVENOUS | Status: DC | PRN
  Administered 2024-06-18: 200 mg via INTRAVENOUS

## 2024-06-18 MED ORDER — BISACODYL 5 MG PO TBEC: 5.0000 mg | DELAYED_RELEASE_TABLET | Freq: Every day | ORAL | Status: DC | PRN

## 2024-06-18 MED ORDER — CHLORHEXIDINE GLUCONATE 0.12 % MT SOLN
15.0000 mL | Freq: Once | OROMUCOSAL | Status: AC
  Administered 2024-06-18: 15 mL via OROMUCOSAL
  Filled 2024-06-18: qty 15

## 2024-06-18 MED ORDER — CHLORHEXIDINE GLUCONATE CLOTH 2 % EX PADS
6.0000 | MEDICATED_PAD | Freq: Once | CUTANEOUS | Status: DC
  Administered 2024-06-18: 6 via TOPICAL

## 2024-06-18 MED ORDER — METFORMIN HCL 500 MG PO TABS
1000.0000 mg | ORAL_TABLET | Freq: Every day | ORAL | Status: DC
Start: 2024-06-18 — End: 2024-06-19
  Administered 2024-06-18: 1000 mg via ORAL
  Filled 2024-06-18: qty 2

## 2024-06-18 MED ORDER — OXYCODONE HCL 5 MG PO TABS: 5.0000 mg | ORAL_TABLET | ORAL | Status: DC | PRN

## 2024-06-18 MED ORDER — LIDOCAINE 2% (20 MG/ML) 5 ML SYRINGE
INTRAMUSCULAR | Status: DC | PRN
  Administered 2024-06-18: 100 mg via INTRAVENOUS

## 2024-06-18 MED ORDER — ASPIRIN 81 MG PO CHEW
81.0000 mg | CHEWABLE_TABLET | Freq: Every day | ORAL | Status: DC
  Administered 2024-06-19: 81 mg via ORAL
  Filled 2024-06-18: qty 1

## 2024-06-18 MED ORDER — SODIUM CHLORIDE 0.9 % IV SOLN: 500.0000 mL | Freq: Once | INTRAVENOUS | Status: DC | PRN

## 2024-06-18 MED ORDER — INSULIN ASPART 100 UNIT/ML IJ SOLN
INTRAMUSCULAR | Status: AC
  Administered 2024-06-18: 2 [IU] via SUBCUTANEOUS
  Filled 2024-06-18: qty 1

## 2024-06-18 MED ORDER — SODIUM CHLORIDE 0.9 % IV SOLN: INTRAVENOUS | Status: AC

## 2024-06-18 MED ORDER — OXYCODONE HCL 5 MG PO TABS: 5.0000 mg | ORAL_TABLET | Freq: Once | ORAL | Status: DC | PRN

## 2024-06-18 MED ORDER — SODIUM CHLORIDE 0.9 % IV SOLN
0.1500 ug/kg/min | INTRAVENOUS | Status: AC
  Administered 2024-06-18: .1 ug/kg/min via INTRAVENOUS
  Filled 2024-06-18: qty 2000

## 2024-06-18 MED ORDER — CLOPIDOGREL BISULFATE 75 MG PO TABS
75.0000 mg | ORAL_TABLET | Freq: Every day | ORAL | Status: DC
  Administered 2024-06-19: 75 mg via ORAL
  Filled 2024-06-18: qty 1

## 2024-06-18 MED ORDER — HYDROMORPHONE HCL 1 MG/ML IJ SOLN: 0.5000 mg | INTRAMUSCULAR | Status: DC | PRN

## 2024-06-18 MED ORDER — SITAGLIPTIN PHOS-METFORMIN HCL 50-500 MG PO TABS
2.0000 | ORAL_TABLET | Freq: Every day | ORAL | Status: DC
Start: 2024-06-18 — End: 2024-06-18

## 2024-06-18 MED ORDER — CLOPIDOGREL BISULFATE 75 MG PO TABS
75.0000 mg | ORAL_TABLET | Freq: Once | ORAL | Status: AC
  Administered 2024-06-18: 75 mg via ORAL
  Filled 2024-06-18: qty 1

## 2024-06-18 MED ORDER — LIDOCAINE 2% (20 MG/ML) 5 ML SYRINGE
INTRAMUSCULAR | Status: AC
  Filled 2024-06-18: qty 10

## 2024-06-18 MED ORDER — PROTAMINE SULFATE 10 MG/ML IV SOLN
INTRAVENOUS | Status: AC
  Filled 2024-06-18: qty 5

## 2024-06-18 MED ORDER — INSULIN ASPART 100 UNIT/ML IJ SOLN: 0.0000 [IU] | Freq: Three times a day (TID) | INTRAMUSCULAR | Status: DC

## 2024-06-18 MED ORDER — 0.9 % SODIUM CHLORIDE (POUR BTL) OPTIME
TOPICAL | Status: DC | PRN
  Administered 2024-06-18: 1000 mL

## 2024-06-18 MED ORDER — OXYCODONE HCL 5 MG/5ML PO SOLN: 5.0000 mg | Freq: Once | ORAL | Status: DC | PRN

## 2024-06-18 MED ORDER — INSULIN ASPART 100 UNIT/ML IJ SOLN
2.0000 [IU] | Freq: Once | INTRAMUSCULAR | Status: AC
  Administered 2024-06-18: 2 [IU] via SUBCUTANEOUS

## 2024-06-18 MED ORDER — ONDANSETRON HCL 4 MG/2ML IJ SOLN
INTRAMUSCULAR | Status: DC | PRN
  Administered 2024-06-18: 4 mg via INTRAVENOUS

## 2024-06-18 MED ORDER — SODIUM CHLORIDE 0.9 % IV SOLN: INTRAVENOUS | Status: DC

## 2024-06-18 SURGICAL SUPPLY — 41 items
BAG BANDED W/RUBBER/TAPE 36X54 (MISCELLANEOUS) ×1 IMPLANT
BAG COUNTER SPONGE SURGICOUNT (BAG) ×1 IMPLANT
CANISTER SUCTION 3000ML PPV (SUCTIONS) ×1 IMPLANT
CATH BALLN ENROUTE 5X35 (CATHETERS) IMPLANT
CLIP TI MEDIUM 6 (CLIP) ×2 IMPLANT
CLIP TI WIDE RED SMALL 6 (CLIP) ×2 IMPLANT
COVER DOME SNAP 22 D (MISCELLANEOUS) ×1 IMPLANT
COVER PROBE W GEL 5X96 (DRAPES) ×1 IMPLANT
DERMABOND ADVANCED .7 DNX12 (GAUZE/BANDAGES/DRESSINGS) ×1 IMPLANT
DRAPE FEMORAL ANGIO 80X135IN (DRAPES) ×1 IMPLANT
ELECTRODE REM PT RTRN 9FT ADLT (ELECTROSURGICAL) ×1 IMPLANT
GOWN STRL REUS W/ TWL LRG LVL3 (GOWN DISPOSABLE) ×2 IMPLANT
GOWN STRL REUS W/TWL 2XL LVL3 (GOWN DISPOSABLE) ×2 IMPLANT
GUIDEWIRE ENROUTE 0.014 (WIRE) ×1 IMPLANT
HEMOSTAT SNOW SURGICEL 2X4 (HEMOSTASIS) IMPLANT
KIT BASIN OR (CUSTOM PROCEDURE TRAY) ×1 IMPLANT
KIT ENCORE 26 ADVANTAGE (KITS) ×1 IMPLANT
KIT INTRODUCER GALT 7 (INTRODUCER) ×1 IMPLANT
KIT TURNOVER KIT B (KITS) ×1 IMPLANT
MARKER SKIN DUAL TIP RULER LAB (MISCELLANEOUS) IMPLANT
NDL HYPO 25GX1X1/2 BEV (NEEDLE) IMPLANT
NEEDLE HYPO 25GX1X1/2 BEV (NEEDLE) IMPLANT
PACK CAROTID (CUSTOM PROCEDURE TRAY) ×1 IMPLANT
POSITIONER HEAD DONUT 9IN (MISCELLANEOUS) ×1 IMPLANT
SET MICROPUNCTURE 5F STIFF (MISCELLANEOUS) ×1 IMPLANT
STATION PROTECTION PRESSURIZED (MISCELLANEOUS) ×1 IMPLANT
STENT TRANSCAROTID 8-6X40 (Permanent Stent) IMPLANT
SUT MNCRL AB 4-0 PS2 18 (SUTURE) ×1 IMPLANT
SUT PROLENE 5 0 C 1 24 (SUTURE) ×1 IMPLANT
SUT SILK 2 0 PERMA HAND 18 BK (SUTURE) IMPLANT
SUT SILK 2 0 SH (SUTURE) ×1 IMPLANT
SUT SILK 3-0 18XBRD TIE 12 (SUTURE) IMPLANT
SUT VIC AB 3-0 SH 27X BRD (SUTURE) ×1 IMPLANT
SYR 10ML LL (SYRINGE) ×3 IMPLANT
SYR 20ML LL LF (SYRINGE) ×1 IMPLANT
SYR CONTROL 10ML LL (SYRINGE) IMPLANT
SYSTEM ENROUTE TCAR NEURO PLUS (FILTER) IMPLANT
TAPE UMBILICAL 1/8X30 (MISCELLANEOUS) IMPLANT
TOWEL GREEN STERILE (TOWEL DISPOSABLE) ×1 IMPLANT
WATER STERILE IRR 1000ML POUR (IV SOLUTION) ×1 IMPLANT
WIRE BENTSON .035X145CM (WIRE) ×1 IMPLANT

## 2024-06-18 NOTE — Progress Notes (Signed)
 Pt arrived to unit from  PACU VSS, A/O x 4,  CCMD called ,CHG given, pt oriented to unit,Will continue to monitor. Left neck and left groin level 0  [name], RN    06/18/24 1136  Vitals  Temp 97.6 F (36.4 C)  Temp Source Oral  BP (!) 140/68  MAP (mmHg) 90  BP Location Right Arm  BP Method Automatic  Patient Position (if appropriate) Lying  Pulse Rate 81  Pulse Rate Source Monitor  ECG Heart Rate 81  Resp 13  Level of Consciousness  Level of Consciousness Alert  Oxygen Therapy  SpO2 97 %  O2 Device Room Air  O2 Flow Rate (L/min) 0 L/min  Pain Assessment  Pain Scale 0-10  Pain Score 1  Pain Type Surgical pain  MEWS Score  MEWS Temp 0  MEWS Systolic 0  MEWS Pulse 0  MEWS RR 1  MEWS LOC 0  MEWS Score 1  MEWS Score Color [place]

## 2024-06-18 NOTE — Op Note (Signed)
 NAME: TIOMBE TOMEO    MRN: [id number] DOB: [birth year]    DATE OF OPERATION: 06/18/2024  PREOP DIAGNOSIS:    Symptomatic left internal carotid artery stenosis   POSTOP DIAGNOSIS:    Same  PROCEDURE:    Left internal carotid artery revascularization  SURGEON: Fonda FORBES Rim  ASSIST: Sherrilee Holster, PA  ANESTHESIA: General   EBL: 5ml  INDICATIONS:    Hailey Johnson is a 78 y.o. 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  Left-sided ICA demonstrated greater than 50% stenosis prompting surgical discussion for revascularization to prevent future risk of stroke.  After discussing risk and benefits of both carotid endarterectomy and transcarotid artery revascularization, Clorine elected to move forward with transcarotid artery revascularization.  FINDINGS:   Greater than 50% stenosis of the internal carotid artery with atherosclerotic disease.  TECHNIQUE:   The patient was brought to the operating room, where support lines were placed and general anesthesia was secured. The left neck and left groin were prepped and the patient was sterilely draped. A transverse 2-4 cm incision was made between the sternal and clavicular heads of the sternocleidomastoid muscle, below the omohyoid. Following longitudinal division of the carotid sheath the jugular vein was partially dissected and retracted medially. Once 3 cm of common carotid artery (CCA) were isolated, umbilical tape was placed around the proximal 1/3 of the CCA under direct vision. A 5.0 polypropylene suture was pre-placed in the anterior wall of the CCA, in a "U stitch" configuration, close to the clavicle to facilitate hemostasis upon removal of the arterial sheath at completion of the TCAR procedure.  The contralateral (left) common femoral vein (CFV) was accessed under ultrasound guidance, using standard Seldinger and micropuncture access technique. Permanent recorded image(s)  was/were saved in the patient's medical record. The Venous Return Sheath was advanced into the CFV over the 0.035" wire provided. Blood was aspirated from the flow line followed by flushing of the Venous Sheath with heparinized saline. The Venous Sheath was secured to the patient's skin with suture to maintain optimal position in the vessel.  Heparin  was given to obtain a therapeutic activated clotting time >250 seconds prior to arterial access. A 4-French non-stiffened ENHANCE Transcarotid / Peripheral Access set was used, puncturing the artery with the 21G needle through the pre-placed "U" stitch while holding gentle traction on the umbilical tape to stabilize and centralize the CCA within the incision. Careful attention was paid to the change in CCA shape when using the umbilical tape to control or lift the artery. The micropuncture wire was then advanced 3-4 cm into the CCA and, the 21G needle was removed. The micropuncture sheath was advanced 2-3 cm into the CCA and the wire and dilator were removed. Pulsatile backflow indicated correct positioning. The provided 0.035 J-tipped guidewire was inserted as close as possible to the bifurcation without engaging the lesion. After micropuncture sheath removal, the Transcarotid Arterial Sheath was advanced to the 2.5cm marker and the 0.035" wire and dilator were then removed. Arterial Sheath position was assessed under fluoroscopy in two projections to ensure that the sheath tip was oriented coaxially in the CCA. The Arterial Sheath was sutured to the patient with gentle forward tension. Blood was slowly aspirated followed by flushing with heparinized saline. No ingress of air bubbles through the passive hemostatic valve was observed. The stopcocks were closed. Traction applied to the CCA previously to facilitate access was gently released.  The Flow Controller was connected  to the Transcarotid Arterial Sheath, prepared by passively allowing a column of arterial  blood to fill the line and connected to the Venous Return Sheath. CCA inflow was occluded proximal to the arteriotomy with a vascular clamp to achieve active flow reversal. To confirm flow reversal, a saline bolus was delivered into the venous flow line on both "High" and "Low" flow settings of the Flow Controller. Angiograms were performed with slow injections of a small amount of contrast filling just past the lesion to minimize antegrade transmission of micro-bubbles.  Prior to lesion manipulation, heart rate (70bpm) and systolic BP (140-176mmHg) were managed upwards to optimize flow reversal and procedural neuroprotection. The lesion was crossed with an 0.014" ENROUTE guidewire and pre-dilation of the lesion was performed with a  5mm x35 mm rapid exchange 0.014" compatible balloon catheter to 8 atmospheres for 10 seconds. Stenting was performed with an tapered 8-73mm x 40mm ENROUTE Transcarotid stent, sized appropriately to the right CCA. AP and lateral angiograms (gentle contrast injections) were performed to confirm stent placement and arterial wall stent apposition.  At [facility] case completion, antegrade flow was restored by releasing the clamp on the CCA then closing the NPS stopcocks to the flow lines. The Transcarotid Arterial Sheath was removed and the pre-closure suture was tied. Heparin  reversal was employed.  The wound bed was irrigated and closed in layers using Vicryl with Monocryl and Dermabond at level the skin.  The Venous Return Sheath was removed and hemostasis was achieved with brief manual compression.  The patient tolerated the procedure well and was extubated on the table. The patient was moving all four extremities to command prior to transfer to the recovery room.   Fonda FORBES Rim, MD [facility] DATE OF DICTATION:   06/18/2024

## 2024-06-18 NOTE — H&P (Signed)
 Hospital Consult   Patient seen and examined in preop holding.  No complaints. No changes to medication history or physical exam since last seen in clinic. After discussing the risks and benefits of Left TCAR for symptomatic ICA stenosis, Hailey Johnson elected to proceed.   Fonda FORBES Rim MD   Reason for Consult:  CVA, carotid artery stenosis Requesting Physician:  Neurology MRN #:  [id number]  History of Present Illness: This is a 78 y.o. female with past medical history significant for hypertension, hyperlipidemia, and diabetes mellitus.  She presented to the ED on Friday afternoon due to slurred speech which occurred a day or 2 prior.  On exam she continues to have slurred speech and also endorses fine motor deficit in her right hand.  Workup included CTA demonstrating at least 50% stenosis of bilateral internal carotid arteries.  Workup also included MR brain demonstrating acute left-sided infarct.  She has been started on aspirin , Plavix , and statin daily.  Her daughter is present during exam.  Past Medical History:  Diagnosis Date   Diabetes mellitus without complication (HCC)    Stroke [facility])     Past Surgical History:  Procedure Laterality Date   COLONOSCOPY      Allergies  Allergen Reactions   Antihistamines, Chlorpheniramine-Type Palpitations   Latex Dermatitis   Pseudoephedrine Hcl Palpitations    Prior to Admission medications   Medication Sig Start Date End Date Taking? Authorizing Provider  glipiZIDE (GLUCOTROL XL) 10 MG 24 hr tablet Take 10 mg by mouth daily with breakfast.   Yes [provider]  sitaGLIPtin -metformin  (JANUMET ) 50-500 MG tablet Take 2 tablets by mouth daily.   Yes [provider]    Social History   Socioeconomic History   Marital status: Married    Spouse name: Not on file   Number of children: Not on file   Years of education: Not on file   Highest education level: Not on file  Occupational History   Not on file   Tobacco Use   Smoking status: Never   Smokeless tobacco: Never  Vaping Use   Vaping status: Never Used  Substance and Sexual Activity   Alcohol  use: Never   Drug use: Never   Sexual activity: Never  Other Topics Concern   Not on file  Social History Narrative   Not on file   Social Drivers of Health   Financial Resource Strain: Not on file  Food Insecurity: No Food Insecurity (06/08/2024)   Hunger Vital Sign    Worried About Running Out of Food in the Last Year: Never true    Ran Out of Food in the Last Year: Never true  Transportation Needs: No Transportation Needs (06/08/2024)   PRAPARE - Administrator, Civil Service (Medical): No    Lack of Transportation (Non-Medical): No  Physical Activity: Not on file  Stress: Not on file  Social Connections: Socially Integrated (06/08/2024)   Social Connection and Isolation Panel    Frequency of Communication with Friends and Family: More than three times a week    Frequency of Social Gatherings with Friends and Family: Three times a week    Attends Religious Services: More than 4 times per year    Active Member of Clubs or Organizations: Yes    Attends Banker Meetings: 1 to 4 times per year    Marital Status: Married  Catering manager Violence: Not At Risk (06/08/2024)   Humiliation, Afraid, Rape, and Kick questionnaire  Fear of Current or Ex-Partner: No    Emotionally Abused: No    Physically Abused: No    Sexually Abused: No    History reviewed. No pertinent family history.  ROS: Otherwise negative unless mentioned in HPI  Physical Examination  Vitals:   06/18/24 0605  BP: (!) 187/76  Pulse: 90  Resp: 20  Temp: 98.2 F (36.8 C)  SpO2: 98%   Body mass index is 36.73 kg/m.  General:  WDWN in NAD Gait: Not observed HENT: WNL, normocephalic Pulmonary: normal non-labored breathing Cardiac: regular Abdomen:  soft, NT/ND, no masses Skin: without rashes Vascular Exam/Pulses: Symmetrical  radial pulses Extremities: without ischemic changes, without Gangrene , without cellulitis; without open wounds;  Musculoskeletal: no muscle wasting or atrophy  Neurologic: A&O X 3; slurred speech; mild fine motor deficit right hand Psychiatric:  The pt has Normal affect. Lymph:  Unremarkable  CBC    Component Value Date/Time   WBC 14.7 (H) 06/18/2024 0610   RBC 4.08 06/18/2024 0610   HGB 12.2 06/18/2024 0610   HCT 36.8 06/18/2024 0610   PLT 379 06/18/2024 0610   MCV 90.2 06/18/2024 0610   MCH 29.9 06/18/2024 0610   MCHC 33.2 06/18/2024 0610   RDW 12.3 06/18/2024 0610   LYMPHSABS 2.6 06/08/2024 1451   MONOABS 0.7 06/08/2024 1451   EOSABS 0.5 06/08/2024 1451   BASOSABS 0.1 06/08/2024 1451    BMET    Component Value Date/Time   NA 134 (L) 06/18/2024 0610   K 4.2 06/18/2024 0610   CL 102 06/18/2024 0610   CO2 18 (L) 06/18/2024 0610   GLUCOSE 199 (H) 06/18/2024 0610   BUN 24 (H) 06/18/2024 0610   CREATININE 1.09 (H) 06/18/2024 0610   CALCIUM  9.2 06/18/2024 0610   GFRNONAA 53 (L) 06/18/2024 0610    COAGS: Lab Results  Component Value Date   INR 1.0 06/18/2024   INR 1.0 06/08/2024     Non-Invasive Vascular Imaging:   CTA demonstrating at least 50% stenosis of bilateral internal carotid arteries  MR brain positive for left-sided CVA    ASSESSMENT/PLAN: This is a 78 y.o. female with left-sided CVA  Hailey Johnson is a 78 year old female who presented to the emergency department with slurred speech.  Workup included MRI brain which was positive for left-sided CVA.  She also had a CTA demonstrating at least 50% stenosis of bilateral internal carotid arteries.  Imaging likely represents asymptomatic left ICA stenosis.  We discussed left carotid endarterectomy versus TCAR.  From a vascular standpoint she can be discharged home and follow-up as an outpatient for surgery.  This will likely occur next Monday, 06/18/2024.  She should continue aspirin , Plavix , statin daily.   On-call vascular surgeon Dr. Lanis was also involved with the evaluation management plan of this patient.   Donnice Sender PA-C [facility] [phone]   VASCULAR STAFF ADDENDUM: I have independently interviewed and examined the patient. I agree with the above.  In short, patient is a 78 year old female with left-sided CVA and CTA demonstrating at least 50% stenosis of the left ICA.  I had a nice conversation with neurology yesterday, and while the area of infarct is usually associated with small vessel disease, there Hitz and multiple vascular beds which is most consistent with an embolic phenomenon.  On exam, Jesse continues to struggle with some speech related issues.  Bilateral strength is normal.  I had a nice conversation with both her and her daughter regarding the need for revascularization.  We discussed that with revascularization I take her risk of stroke at 2 years from 26% to 9%.  We discussed carotid endarterectomy versus transcarotid artery revascularization.  After discussing the risks and benefits of both which shared decision making, she elected to proceed with transcarotid artery revascularization.  This will be planned as an outpatient for next Monday.  Please continue dual antiplatelet therapy, high intensity statin through the surgery.  Fonda FORBES Rim MD [facility] Phone Number: [phone] 06/18/2024 7:33 AM

## 2024-06-18 NOTE — Transfer of Care (Signed)
 Immediate Anesthesia Transfer of Care Note  Patient: Hailey Johnson  Procedure(s) Performed: LEFT TRANSCAROTID ARTERY REVASCULARIZATION (Left: Neck) ULTRASOUND GUIDANCE, FOR VASCULAR ACCESS (Left: Neck)  Patient Location: PACU  Anesthesia Type:General  Level of Consciousness: awake  Airway & Oxygen Therapy: Patient Spontanous Breathing and Patient connected to face mask oxygen  Post-op Assessment: Report given to RN and Post -op Vital signs reviewed and stable  Post vital signs: Reviewed and stable  Last Vitals:  Vitals Value Taken Time  BP 144/60 06/18/24 09:57  Temp    Pulse 83 06/18/24 09:58  Resp 13 06/18/24 09:58  SpO2 94 % 06/18/24 09:58  Vitals shown include unfiled device data.  Last Pain:  Vitals:   06/18/24 0619  TempSrc:   PainSc: 0-No pain      Patients Stated Pain Goal: 0 (06/18/24 9380)  Complications: No notable events documented.

## 2024-06-18 NOTE — Anesthesia Procedure Notes (Signed)
 Procedure Name: Intubation Date/Time: 06/18/2024 7:59 AM  Performed by: Virgil Ee, CRNAPre-anesthesia Checklist: Patient identified, Patient being monitored, Timeout performed, Emergency Drugs available and Suction available Patient Re-evaluated:Patient Re-evaluated prior to induction Oxygen Delivery Method: Circle system utilized Preoxygenation: Pre-oxygenation with 100% oxygen Induction Type: IV induction Ventilation: Mask ventilation without difficulty Laryngoscope Size: Mac and 4 Grade View: Grade II Tube type: Oral Tube size: 6.5 mm Number of attempts: 1 Airway Equipment and Method: Stylet Placement Confirmation: ETT inserted through vocal cords under direct vision, positive ETCO2 and breath sounds checked- equal and bilateral Secured at: 22 cm Tube secured with: Tape Dental Injury: Teeth and Oropharynx as per pre-operative assessment

## 2024-06-18 NOTE — Anesthesia Procedure Notes (Signed)
 Arterial Line Insertion Start/End9/22/2025 7:20 AM, 06/18/2024 7:30 AM Performed by: Erma Thom SAUNDERS, MD, anesthesiologist  Patient location: Pre-op. Preanesthetic checklist: patient identified, IV checked, site marked, risks and benefits discussed, surgical consent, monitors and equipment checked, pre-op evaluation, timeout performed and anesthesia consent Lidocaine  1% used for infiltration radial was placed Catheter size: 20 G Hand hygiene performed  and maximum sterile barriers used   Attempts: 1 (First attempt CRNA) Procedure performed using ultrasound guided technique. Following insertion, dressing applied and Biopatch. Post procedure assessment: normal and unchanged  Patient tolerated the procedure well with no immediate complications.

## 2024-06-18 NOTE — Discharge Instructions (Signed)

## 2024-06-18 NOTE — Progress Notes (Signed)
 No deficits, surgical site soft.

## 2024-06-18 NOTE — Progress Notes (Signed)
 Dr. Lanis made aware of today's abnormal urinalysis result. No new orders received.  Dr. Lanis also made aware that the patient states she last took Aspirin  and Plavix  yesterday morning. Verbal order received from Dr. Lanis to give Aspirin  and Plavix  this morning.

## 2024-06-18 NOTE — Anesthesia Postprocedure Evaluation (Signed)
 Anesthesia Post Note  Patient: Hailey Johnson  Procedure(s) Performed: LEFT TRANSCAROTID ARTERY REVASCULARIZATION (Left: Neck) ULTRASOUND GUIDANCE, FOR VASCULAR ACCESS (Left: Neck)     Patient location during evaluation: PACU Anesthesia Type: General Level of consciousness: awake and patient cooperative Pain management: pain level controlled Vital Signs Assessment: post-procedure vital signs reviewed and stable Respiratory status: spontaneous breathing Cardiovascular status: stable Postop Assessment: no headache, no apparent nausea or vomiting and adequate PO intake Anesthetic complications: no Comments: Patient assessed at bedside. Received additional Insulin  in PACU. VSS. Neuro exam unchanged from preoperative assessment.   No notable events documented.               Lauraine KATHEE [place]

## 2024-06-19 ENCOUNTER — Encounter (HOSPITAL_COMMUNITY): Payer: Self-pay | Admitting: Vascular Surgery

## 2024-06-19 ENCOUNTER — Other Ambulatory Visit (HOSPITAL_COMMUNITY): Payer: Self-pay

## 2024-06-19 DIAGNOSIS — Z48812 Encounter for surgical aftercare following surgery on the circulatory system: Secondary | ICD-10-CM

## 2024-06-19 LAB — LIPID PANEL
Cholesterol: 98 mg/dL (ref 0–200)
HDL: 51 mg/dL (ref >40)
LDL Cholesterol: 19 mg/dL (ref 0–99)
Total CHOL/HDL Ratio: 1.9 ratio
Triglycerides: 140 mg/dL (ref <150)
VLDL: 28 mg/dL (ref 0–40)

## 2024-06-19 LAB — CBC
HCT: 32.6 % — ABNORMAL LOW (ref 36.0–46.0)
Hemoglobin: 11.1 g/dL — ABNORMAL LOW (ref 12.0–15.0)
MCH: 29.8 pg (ref 26.0–34.0)
MCHC: 34 g/dL (ref 30.0–36.0)
MCV: 87.6 fL (ref 80.0–100.0)
Platelets: 364 K/uL (ref 150–400)
RBC: 3.72 MIL/uL — ABNORMAL LOW (ref 3.87–5.11)
RDW: 12.3 % (ref 11.5–15.5)
WBC: 15.5 K/uL — ABNORMAL HIGH (ref 4.0–10.5)
nRBC: 0 % (ref 0.0–0.2)

## 2024-06-19 LAB — BASIC METABOLIC PANEL WITH GFR
Anion gap: 10 (ref 5–15)
BUN: 15 mg/dL (ref 8–23)
CO2: 22 mmol/L (ref 22–32)
Calcium: 8.6 mg/dL — ABNORMAL LOW (ref 8.9–10.3)
Chloride: 100 mmol/L (ref 98–111)
Creatinine, Ser: 0.95 mg/dL (ref 0.44–1.00)
GFR, Estimated: >60 mL/min (ref >60)
Glucose, Bld: 268 mg/dL — ABNORMAL HIGH (ref 70–99)
Potassium: 3.7 mmol/L (ref 3.5–5.1)
Sodium: 132 mmol/L — ABNORMAL LOW (ref 135–145)

## 2024-06-19 LAB — GLUCOSE, CAPILLARY
Glucose-Capillary: 195 mg/dL — ABNORMAL HIGH (ref 70–99)
Glucose-Capillary: 237 mg/dL — ABNORMAL HIGH (ref 70–99)

## 2024-06-19 MED ORDER — OXYCODONE HCL 5 MG PO TABS
5.0000 mg | ORAL_TABLET | Freq: Four times a day (QID) | ORAL | 0 refills | Status: DC | PRN
  Filled 2024-06-19: qty 12, 3d supply, fill #0

## 2024-06-19 NOTE — Inpatient Diabetes Management (Signed)
 Inpatient Diabetes Program Recommendations  AACE/ADA: New Consensus Statement on Inpatient Glycemic Control (2015)  Target Ranges:  Prepandial:   less than 140 mg/dL      Peak postprandial:   less than 180 mg/dL (1-2 hours)      Critically ill patients:  140 - 180 mg/dL    Latest Reference Range & Units 06/18/24 06:07 06/18/24 09:47 06/18/24 11:32 06/18/24 15:19 06/18/24 17:06 06/18/24 20:51  Glucose-Capillary 70 - 99 mg/dL 788 (H) 791 (H) 762 (H) 301 (H) 346 (H) 262 (H)  (H): Data is abnormally high  Latest Reference Range & Units 06/19/24 06:46  Glucose-Capillary 70 - 99 mg/dL 804 (H)  (H): Data is abnormally high   Admit for Left internal carotid artery revascularization (was just here a week ago for CVA--discharged home 9/15 new to Insulin  and CGM)  Home DM Meds:  Lantus  18 units daily  Novolog  2-10 units TID per SSI Janumet  50/500 2 tabs daily    Current Orders:  Lantus  18 units daily  Novolog  2-10 units TID (home SSI)  Metformin  1000 mg daily  Tradjenta  5 mg daily   Asked by PA to come see pt.  Issues with her Freestyle Libre 3 plus glucose sensor.  When I got to pt's room, pt was currently wearing FSL3 plus sensor on her left arm.  We tried to scan the sensor (it was placed on her L arm the day prior by family member) but when scanned, the app gave an error reading.  Suspect sensor sat too long without being scanned.  I removed the sensor from her left arm and assisted pt to place a new sensor on her R arm.  Pt successfully placed the sensor and scanned the new sensor with her phone.  1hr warm up period began at 10:20am.  Pt should see glucose reading at 11:20am.  RN aware that sensor applied and when warm up period complete.    --Will follow patient during hospitalization--  Adina Rudolpho Arrow RN, MSN, CDCES Diabetes Coordinator Inpatient Glycemic Control Team Team Pager: [phone] (8a-5p)

## 2024-06-19 NOTE — Progress Notes (Addendum)
  Progress Note    06/19/2024 7:47 AM 1 Day Post-Op  Subjective:  no complaints    Vitals:   06/19/24 0424 06/19/24 0724  BP: (!) 151/78 (!) 154/74  Pulse: 98 79  Resp: 19 15  Temp: 98 F (36.7 C) 98 F (36.7 C)  SpO2: 98% 99%    Physical Exam: General:  resting comfortably in bed, NAD Cardiac:  regular Lungs:  nonlabored Incisions:  left sided neck incision soft without hematoma Extremities:  moving all extremities equally Neuro: no neurological deficits on exam  CBC    Component Value Date/Time   WBC 14.7 (H) 06/18/2024 0610   RBC 4.08 06/18/2024 0610   HGB 12.2 06/18/2024 0610   HCT 36.8 06/18/2024 0610   PLT 379 06/18/2024 0610   MCV 90.2 06/18/2024 0610   MCH 29.9 06/18/2024 0610   MCHC 33.2 06/18/2024 0610   RDW 12.3 06/18/2024 0610   LYMPHSABS 2.6 06/08/2024 1451   MONOABS 0.7 06/08/2024 1451   EOSABS 0.5 06/08/2024 1451   BASOSABS 0.1 06/08/2024 1451    BMET    Component Value Date/Time   NA 134 (L) 06/18/2024 0610   K 4.2 06/18/2024 0610   CL 102 06/18/2024 0610   CO2 18 (L) 06/18/2024 0610   GLUCOSE 199 (H) 06/18/2024 0610   BUN 24 (H) 06/18/2024 0610   CREATININE 1.09 (H) 06/18/2024 0610   CALCIUM  9.2 06/18/2024 0610   GFRNONAA 53 (L) 06/18/2024 0610    INR    Component Value Date/Time   INR 1.0 06/18/2024 0610     Intake/Output Summary (Last 24 hours) at 06/19/2024 0747 Last data filed at 06/18/2024 1600 Gross per 24 hour  Intake 1060.36 ml  Output --  Net 1060.36 ml      Assessment/Plan:  78 y.o. female is 1 day post op, s/p: L TCAR   -She is doing well this morning without complaints -Left sided neck incision is intact without hematoma or drainage -She is neurologically intact on exam without slurred speech, weakness, or numbness -She is tolerating a normal diet, voiding without difficulty, and ambulating at baseline -Will plan for d/c later this morning if labs are stable.  -Continue asa, plavix , and statin. Will  arrange follow up in 4 wks   Ahmed Holster, [place] [facility] [phone] 06/19/2024 7:47 AM  VASCULAR STAFF ADDENDUM: I have independently interviewed and examined the patient. I agree with the above.    Fonda FORBES Rim MD [facility] Phone Number: [phone] 06/19/2024 9:41 AM

## 2024-06-19 NOTE — TOC Transition Note (Signed)
 Transition of Care (TOC) - Discharge Note Rayfield Gobble RN, BSN Inpatient Care Management Unit 4E- RN Case Manager See Treatment Team for direct phone #   Patient Details  Name: Hailey Johnson MRN: [id number] Date of Birth: 10-13-1945  Transition of Care [facility]) CM/SW Contact:  Gobble Rayfield Hurst, RN Phone Number: 06/19/2024, 11:03 AM   Clinical Narrative:    Pt stable for transition home today, s/p TCAR.   Pt home w/ spouse, family to transport home.   Note that pt active with [facility] from previous admit. Call made to [facility] liaison to confirm services.  Per Darleene reinhold Cella pt active with PT/OT/SLP and they can resume services.   Orders placed for resumption of HH- PT/OT/SLP as previously ordered.   No further CM needs noted.    Final next level of care: Home w Home Health Services Barriers to Discharge: No Barriers Identified   Patient Goals and CMS Choice Patient states their goals for this hospitalization and ongoing recovery are:: return home   Choice offered to / list presented to : Patient      Discharge Placement                 Home w/ [facility]      Discharge Plan and Services Additional resources added to the After Visit Summary for   In-house Referral: NA Discharge Planning Services: CM Consult Post Acute Care Choice: Home Health, Resumption of Svcs/PTA Provider          DME Arranged: N/A DME Agency: NA       HH Arranged: PT, OT, Speech Therapy HH Agency: [facility] Date [facility] Agency Contacted: 06/19/24 Time HH Agency Contacted: 1030 Representative spoke with at [facility] Agency: Darleene Gowda  Social Drivers of Health (SDOH) Interventions SDOH Screenings   Food Insecurity: No Food Insecurity (06/18/2024)  Housing: Low Risk  (06/18/2024)  Transportation Needs: No Transportation Needs (06/18/2024)  Utilities: Not At Risk (06/18/2024)  Social Connections: Unknown (06/18/2024)  Tobacco Use: Low Risk  (06/18/2024)     Readmission Risk  Interventions    06/19/2024   11:03 AM  Readmission Risk Prevention Plan  Post Dischage Appt Complete  Medication Screening Complete  Transportation Screening Complete

## 2024-06-19 NOTE — Progress Notes (Signed)
 Reviewed AVS, patient expressed understanding of medications, MD follow up reviewed.   Removed IV, Site clean, dry and intact.  See LDA for information on wounds at discharge. Patient states all belongings brought to the hospital at time of admission are accounted for and packed to take home.  Patient refused to pick up medications from [facility] pharmacy. Vol. Transport contacted to transport patient to entrance A, where family member was waiting in vehicle to transport home.

## 2024-06-26 DIAGNOSIS — E1169 Type 2 diabetes mellitus with other specified complication: Secondary | ICD-10-CM | POA: Diagnosis not present

## 2024-06-26 DIAGNOSIS — I6523 Occlusion and stenosis of bilateral carotid arteries: Secondary | ICD-10-CM | POA: Diagnosis not present

## 2024-06-26 DIAGNOSIS — I10 Essential (primary) hypertension: Secondary | ICD-10-CM | POA: Diagnosis not present

## 2024-06-27 NOTE — Discharge Summary (Signed)
 Discharge Summary     Hailey Johnson [birth year] 78 y.o. female  [id number]  Admission Date: 06/18/2024  Discharge Date: 06/19/2024  Physician: Fonda Rim, MD  Admission Diagnosis: Symptomatic carotid artery stenosis, left [I65.22] Carotid stenosis, left [I65.22]  Discharge Day Diagnosis: Symptomatic carotid artery stenosis, left [I65.22] Carotid stenosis, left [facility]  Hospital Course:  The patient was admitted to the hospital and taken to the operating room on 06/18/2024 and underwent left TCAR.  The pt tolerated the procedure well and was transported to the PACU in good condition.  By POD 1, the pt neuro status was intact.  Her left-sided neck incision was intact without hematoma.  Hemoglobin was stable at 11.1.  She was tolerating a normal diet without swallowing difficulty, ambulating at baseline, and voiding without difficulty.  She was discharged home on POD1.   No results for input(s): NA, K, CL, CO2, GLUCOSE, BUN, CALCIUM  in the last 72 hours.  Invalid input(s): CRATININE No results for input(s): WBC, HGB, HCT, PLT in the last 72 hours. No results for input(s): INR in the last 72 hours.   Discharge Instructions     Call MD for:  redness, tenderness, or signs of infection (pain, swelling, redness, odor or green/yellow discharge around incision site)   Complete by: As directed    Call MD for:  severe uncontrolled pain   Complete by: As directed    Call MD for:  temperature >100.4   Complete by: As directed    Diet - low sodium heart healthy   Complete by: As directed    Discharge wound care:   Complete by: As directed    Wash your incision daily with soap and water, then pat dry   Increase activity slowly   Complete by: As directed        Discharge Diagnosis:  Symptomatic carotid artery stenosis, left [I65.22] Carotid stenosis, left [I65.22]  Secondary Diagnosis: Patient Active Problem List   Diagnosis Date Noted    Carotid stenosis, left 06/18/2024   Symptomatic carotid artery stenosis, left 06/18/2024   CVA (cerebral vascular accident) (HCC) 06/08/2024   Stroke (cerebrum) (HCC) 06/08/2024   Past Medical History:  Diagnosis Date   Diabetes mellitus without complication (HCC)    Stroke (HCC)     Allergies as of 06/19/2024       Reactions   Antihistamines, Chlorpheniramine-type Palpitations   Latex Dermatitis   Pseudoephedrine Hcl Palpitations        Medication List     TAKE these medications    Aspirin  Low Dose 81 MG chewable tablet Generic drug: aspirin  Chew 1 tablet (81 mg total) by mouth daily.   clopidogrel  75 MG tablet Commonly known as: PLAVIX  Take 1 tablet (75 mg total) by mouth daily.   Lancet Device Misc Use in the morning, at noon, and at bedtime. May substitute to any manufacturer covered by patient's insurance.   Lantus  SoloStar 100 UNIT/ML Solostar Pen Generic drug: insulin  glargine Inject 18 Units total into the skin daily.   NovoLOG  FlexPen 100 UNIT/ML FlexPen Generic drug: insulin  aspart Inject into the skin before each meal 3 times a day based on blood glucose: 140-199 = 2 units, 200-250 = 4 units, 251-299 = 6 units,  300-349 = 8 units,  350 or above 10 units.   OneTouch Delica Plus Lancet33G Misc Use 1 each in the morning, at noon, and at bedtime.   OneTouch Verio Flex System w/Device Kit Use in the morning, at noon, and at bedtime.  OneTouch Verio test strip Generic drug: glucose blood Use 1 each in the morning, at noon, and at bedtime.   oxyCODONE  5 MG immediate release tablet Commonly known as: Oxy IR/ROXICODONE  Take 1 tablet (5 mg total) by mouth every 6 (six) hours as needed for moderate pain (pain score 4-6).   rosuvastatin  20 MG tablet Commonly known as: CRESTOR  Take 1 tablet (20 mg total) by mouth daily.   sitaGLIPtin -metformin  50-500 MG tablet Commonly known as: JANUMET  Take 2 tablets by mouth daily.   TechLite Pen Needles 32G X 4 MM  Misc Generic drug: Insulin  Pen Needle Use 1 each to inject insulin  into the skin 4 (four) times daily.               Discharge Care Instructions  (From admission, onward)           Start     Ordered   06/19/24 0000  Discharge wound care:       Comments: Wash your incision daily with soap and water, then pat dry   06/19/24 0900             Discharge Instructions:   [facility] Discharge Instructions Carotid Revascularization  Please refer to the following instructions for your post-procedure care. Your surgeon or physician assistant will discuss any changes with you.  Activity  You are encouraged to walk as much as you can. You can slowly return to normal activities but must avoid strenuous activity and heavy lifting until your doctor tell you it's OK. Avoid activities such as vacuuming or swinging a golf club. You can drive after one week if you are comfortable and you are no longer taking prescription pain medications. It is normal to feel tired for serval weeks after your surgery. It is also normal to have difficulty with sleep habits, eating, and bowel movements after surgery. These will go away with time.  Bathing/Showering  You may shower after you come home. Do not soak in a bathtub, hot tub, or swim until the incision heals completely.  Incision Care  Shower every day. Clean your incision with mild soap and water. Pat the area dry with a clean towel. You do not need a bandage unless otherwise instructed. Do not apply any ointments or creams to your incision. You may have skin glue on your incision. Do not peel it off. It will come off on its own in about one week. Your incision may feel thickened and raised for several weeks after your surgery. This is normal and the skin will soften over time. For Men Only: It's OK to shave around the incision but do not shave the incision itself for 2 weeks. It is common to have numbness under  your chin that could last for several months.  Diet  Resume your normal diet. There are no special food restrictions following this procedure. A low fat/low cholesterol diet is recommended for all patients with vascular disease. In order to heal from your surgery, it is CRITICAL to get adequate nutrition. Your body requires vitamins, minerals, and protein. Vegetables are the best source of vitamins and minerals. Vegetables also provide the perfect balance of protein. Processed food has little nutritional value, so try to avoid this.  Medications  Resume taking all of your medications unless your doctor or physician assistant tells you not to.  If your incision is causing pain, you may take over-the- counter pain relievers such as acetaminophen  (Tylenol ). If you were prescribed a stronger  pain medication, please be aware these medications can cause nausea and constipation.  Prevent nausea by taking the medication with a snack or meal. Avoid constipation by drinking plenty of fluids and eating foods with a high amount of fiber, such as fruits, vegetables, and grains. Do not take Tylenol  if you are taking prescription pain medications.  Follow Up  Our office will schedule a follow up appointment 2-3 weeks following discharge.  Please call us  immediately for any of the following conditions  Increased pain, redness, drainage (pus) from your incision site. Fever of 101 degrees or higher. If you should develop stroke (slurred speech, difficulty swallowing, weakness on one side of your body, loss of vision) you should call 911 and go to the nearest emergency room.  Reduce your risk of vascular disease:  Stop smoking. If you would like help call [facility] at 1-800-QUIT-NOW (209-371-8548) or [place] at [phone]. Manage your cholesterol Maintain a desired weight Control your diabetes Keep your blood pressure down  If you have any questions, please call the office at  [phone].  Disposition: Home  Patient's condition: is Excellent  Follow up: 1. [facility] in 4 weeks.   Ahmed Holster, PA-C [facility] [phone]   --- For [facility] use ---   Modified Rankin score at D/C (0-6): 0  IV medication needed for:  1. Hypertension: No 2. Hypotension: No  Post-op Complications: No  1. Post-op CVA or TIA: No  If yes: Event classification (right eye, left eye, right cortical, left cortical, verterobasilar, other):   If yes: Timing of event (intra-op, <6 hrs post-op, >=6 hrs post-op, unknown):   2. CN injury: No  If yes: CN  injuried   3. Myocardial infarction: No  If yes: Dx by (EKG or clinical, Troponin):   4.  CHF: No  5.  Dysrhythmia (new): No  6. Wound infection: No  7. Reperfusion symptoms: No  8. Return to OR: No  If yes: return to OR for (bleeding, neurologic, other CEA incision, other):   Discharge medications: Statin use:  Yes ASA use:  Yes   Beta blocker use:  No ACE-Inhibitor use:  No  ARB use:  No CCB use: No P2Y12 Antagonist use: Yes, [x ] Plavix , [ ]  Plasugrel, [ ]  Ticlopinine, [ ]  Ticagrelor, [ ]  Other, [ ]  No for medical reason, [ ]  Non-compliant, [ ]  Not-indicated Anti-coagulant use:  No, [ ]  Warfarin, [ ]  Rivaroxaban, [ ]  Dabigatran,

## 2024-06-28 ENCOUNTER — Other Ambulatory Visit: Payer: Self-pay | Admitting: *Deleted

## 2024-06-28 DIAGNOSIS — I6522 Occlusion and stenosis of left carotid artery: Secondary | ICD-10-CM

## 2024-07-11 ENCOUNTER — Ambulatory Visit: Admitting: Adult Health

## 2024-07-11 ENCOUNTER — Encounter: Payer: Self-pay | Admitting: Adult Health

## 2024-07-11 VITALS — BP 136/74 | HR 78 | Ht 64.0 in | Wt 215.0 lb

## 2024-07-11 DIAGNOSIS — I69322 Dysarthria following cerebral infarction: Secondary | ICD-10-CM

## 2024-07-11 DIAGNOSIS — Z9862 Peripheral vascular angioplasty status: Secondary | ICD-10-CM

## 2024-07-11 DIAGNOSIS — I63232 Cerebral infarction due to unspecified occlusion or stenosis of left carotid arteries: Secondary | ICD-10-CM | POA: Diagnosis not present

## 2024-07-11 NOTE — Progress Notes (Signed)
 I agree with the above plan

## 2024-07-11 NOTE — Progress Notes (Signed)
 [facility] [address] [place]. [place] [address] [phone]       HOSPITAL FOLLOW UP NOTE  [name] Date of Birth:  [birth year] Medical Record Number:  [id number]   Reason for Referral:  hospital stroke follow up    SUBJECTIVE:   CHIEF COMPLAINT:  Chief Complaint  Patient presents with   Cerebrovascular Accident    Rm 3 alone Pt is well and stable, reports no stroke concerns.     HPI:   [name] is a 78 y.o. female with history of hypertension, hyperlipidemia, obesity, type 2 diabetes who presented to ED on 06/10/2024 with slurred speech was hypertensive on arrival with readings of 203/97 and 192/100.  Stroke workup revealed left CR infarct likely from large vessel disease from left MCA and ICA stenosis.  Evaluated by [facility] with plans on proceeding with TCAR on 9/22.  Placed on aspirin  and Plavix  with duration determined by [facility].  Started Crestor  20 mg daily.  Blood pressure gradually normalized and use of hydralazine  as needed.  Uncontrolled DM with A1c 8.0 and advise close PCP follow-up.  Therapies recommended home health therapy.   Today, 07/11/2024, patient is being seen for initial hospital follow-up unaccompanied.  Reports overall gradually recovering since discharge.  Still some issues with slurred speech but gradually improving.  Completed home health PT/OT/SLP, continues to do exercises at home.  Ambulates with a cane, no recent falls.  Denies any residual weakness.  Has returned back to most prior activities without difficulty, does question return to driving.  No new stroke/TIA symptoms.  Remains on aspirin  and Plavix  as well as Crestor  without side effects.  Has follow-up with vascular surgery next week.  Has since been seen by PCP and started on valsartan 2 weeks ago with overall improvement of blood pressure.  Has follow-up visit with PCP next week.  No further questions or concerns at this time.      PERTINENT IMAGING  CT  head with no acute abnormality. CTA head & neck showed no LVO, some irregularity and moderate narrowing of proximal M2 branch of left MCA.  Moderate to severe long segment narrowing of right ACA from distal A2 segment to proximal V4 segment.  Irregularity and moderate narrowing of anterior P2 segment of left PCA, atherosclerosis of the right carotid bifurcation and proximal right cervical ICA-approximately 60% stenosis.  Mild atherosclerosis at the left carotid bifurcation and nearly circumferential atherosclerotic plaque along the proximal left cervical ICA-50% stenosis. MRI brain showed acute nonhemorrhagic infarct along left lenticular form nucleus and corona radiata, mildly advanced periventricular and subcortical T2 hyperintensities, worse on the left.  Some remote lacunar infarcts in the left corona radiata. Carotid Doppler unremarkable, but likely underestimate 2D Echo EF 60-65% LDL 82 HgbA1c 8.0    ROS:   14 system review of systems performed and negative with exception of those listed in HPI  PMH:  Past Medical History:  Diagnosis Date   Diabetes mellitus without complication (HCC)    Stroke (HCC)     PSH:  Past Surgical History:  Procedure Laterality Date   COLONOSCOPY     TRANSCAROTID ARTERY REVASCULARIZATION  Left 06/18/2024   Procedure: LEFT TRANSCAROTID ARTERY REVASCULARIZATION;  Surgeon: Lanis Fonda BRAVO, MD;  Location: [facility] OR;  Service: Vascular;  Laterality: Left;   ULTRASOUND GUIDANCE FOR VASCULAR ACCESS Left 06/18/2024   Procedure: ULTRASOUND GUIDANCE, FOR VASCULAR ACCESS;  Surgeon: Lanis Fonda BRAVO, MD;  Location: [facility] OR;  Service: Vascular;  Laterality:  Left;    Social History:  Social History   Socioeconomic History   Marital status: Married    Spouse name: Not on file   Number of children: Not on file   Years of education: Not on file   Highest education level: Not on file  Occupational History   Not on file  Tobacco Use   Smoking status: Never    Smokeless tobacco: Never  Vaping Use   Vaping status: Never Used  Substance and Sexual Activity   Alcohol  use: Never   Drug use: Never   Sexual activity: Never  Other Topics Concern   Not on file  Social History Narrative   Not on file   Social Drivers of Health   Financial Resource Strain: Not on file  Food Insecurity: No Food Insecurity (06/18/2024)   Hunger Vital Sign    Worried About Running Out of Food in the Last Year: Never true    Ran Out of Food in the Last Year: Never true  Transportation Needs: No Transportation Needs (06/18/2024)   PRAPARE - Transportation    Lack of Transportation (Medical): No    Lack of Transportation (Non-Medical): No  Physical Activity: Not on file  Stress: Not on file  Social Connections: Unknown (06/18/2024)   Social Connection and Isolation Panel    Frequency of Communication with Friends and Family: More than three times a week    Frequency of Social Gatherings with Friends and Family: Three times a week    Attends Religious Services: Not on file    Active Member of Clubs or Organizations: Yes    Attends Club or Organization Meetings: 1 to 4 times per year    Marital Status: Married  Intimate Partner Violence: Unknown (06/18/2024)   Humiliation, Afraid, Rape, and Kick questionnaire    Fear of Current or Ex-Partner: No    Emotionally Abused: No    Physically Abused: Not on file    Sexually Abused: No    Family History: History reviewed. No pertinent family history.  Medications:   Current Outpatient Medications on File Prior to Visit  Medication Sig Dispense Refill   aspirin  81 MG chewable tablet Chew 1 tablet (81 mg total) by mouth daily. 30 tablet 1   clopidogrel  (PLAVIX ) 75 MG tablet Take 1 tablet (75 mg total) by mouth daily. 30 tablet 0   insulin  aspart (NOVOLOG ) 100 UNIT/ML FlexPen Inject into the skin before each meal 3 times a day based on blood glucose: 140-199 = 2 units, 200-250 = 4 units, 251-299 = 6 units,  300-349 = 8  units,  350 or above 10 units. 15 mL 0   insulin  glargine (LANTUS  SOLOSTAR) 100 UNIT/ML Solostar Pen Inject 18 Units total into the skin daily. 15 mL 0   Insulin  Pen Needle (TECHLITE PEN NEEDLES) 32G X 4 MM MISC Use 1 each to inject insulin  into the skin 4 (four) times daily. 100 each 0   rosuvastatin  (CRESTOR ) 20 MG tablet Take 1 tablet (20 mg total) by mouth daily. 30 tablet 0   sitaGLIPtin -metformin  (JANUMET ) 50-500 MG tablet Take 2 tablets by mouth daily.     valsartan (DIOVAN) 80 MG tablet Take 80 mg by mouth daily.     No current facility-administered medications on file prior to visit.    Allergies:   Allergies  Allergen Reactions   Antihistamines, Chlorpheniramine-Type Palpitations   Latex Dermatitis   Pseudoephedrine Hcl Palpitations      OBJECTIVE:  Physical Exam  Vitals:  07/11/24 1003  BP: 136/74  Pulse: 78  Weight: 215 lb (97.5 kg)  Height: 5' 4 (1.626 m)   Body mass index is 36.9 kg/m. No results found.   General: well developed, well nourished, very pleasant elderly Caucasian female, seated, in no evident distress  Neurologic Exam Mental Status: Awake and fully alert.  Mild dysarthria.  No evidence of aphasia.  Oriented to place and time. Recent and remote memory intact. Attention span, concentration and fund of knowledge appropriate. Mood and affect appropriate.  Cranial Nerves: Pupils equal, briskly reactive to light. Extraocular movements full without nystagmus. Visual fields full to confrontation. Hearing intact. Facial sensation intact.  Slight right lower facial weakness.  Tongue, palate moves normally and symmetrically.  Motor: Normal bulk and tone. Normal strength in all tested extremity muscles Sensory.: intact to touch , pinprick , position and vibratory sensation.  Coordination: Rapid alternating movements normal in all extremities. Finger-to-nose and heel-to-shin performed accurately bilaterally. Gait and Station: Arises from chair without  difficulty. Stance is normal. Gait demonstrates antalgic gait with use of cane Reflexes: 1+ and symmetric. Toes downgoing.     NIHSS  2 Modified Rankin  2      ASSESSMENT: Hailey Johnson is a 78 y.o. year old female with left CR stroke on 06/08/2024 likely secondary to left MCA and ICA stenosis s/p TCAR 06/18/2024. Vascular risk factors include HTN, HLD, DM, carotid stenosis and obesity.      PLAN:  Left CR stroke: S/p TCAR  Residual deficit: Mild dysarthria and mild right facial droop.  Overall greatly recovering.  Completed home health therapy.  Declines interest in additional outpatient therapy but advised to call if interested in pursuing.  She will continue doing exercises as advised. She can return back to driving as I do not feel her residual stroke deficits would interfere with her ability to safely return back to driving, discussed graduated return to driving with additional information provided in AVS. Continue aspirin  81mg  daily and Plavix  and rosuvastatin  (Crestor ) for secondary stroke prevention managed/prescribed by PCP.   F/u with [facility] 10/23, duration of DAPT determined by [facility] Discussed secondary stroke prevention measures and importance of close PCP follow up for aggressive stroke risk factor management including BP goal<130/90, HLD with LDL goal<70 and DM with A1c.<7 .  Stroke labs 05/2024: LDL 82, A1c 8.0 I have gone over the pathophysiology of stroke, warning signs and symptoms, risk factors and their management in some detail with instructions to go to the closest emergency room for symptoms of concern.     No further recommendations from stroke standpoint and is being closely followed by PCP.  She can follow-up here on an as-needed basis but advised to call with any questions or concerns in the future.   CC:  [facility] provider: Dr. Rosemarie PCP: Dyane Anthony RAMAN, FNP    I personally spent a total of 55 minutes in the care of the patient today including preparing to see  the patient, getting/reviewing separately obtained history, performing a medically appropriate exam/evaluation, counseling and educating, and documenting clinical information in the EHR.    Harlene Bogaert, AGNP-BC  [facility] [address] [place], [place] [address]  Phone [phone] Fax [phone] Note: This document was prepared with digital dictation and possible smart phrase technology. Any transcriptional errors that result from this process are unintentional.

## 2024-07-11 NOTE — Patient Instructions (Addendum)
 Continue to do exercises as advised by therapy - if you are interested in doing additional therapy, please let me know  Okay to return back to driving at this time with graduated return to driving. Have a license driver with you for the first few times to ensure reaction time and multi tasking are intact. Please avoid highways and night time driving for the next several weeks.   Continue aspirin  81 mg daily and clopidogrel  75 mg daily  and Crestor  for secondary stroke prevention.  Ongoing duration of both aspirin  and Plavix  will be determined by vascular surgery at your follow-up visit  Continue to follow up with PCP regarding blood pressure, cholesterol and diabetes management  Maintain strict control of hypertension with blood pressure goal below 130/90, diabetes with hemoglobin A1c goal below 7.0 % and cholesterol with LDL cholesterol (bad cholesterol) goal below 70 mg/dL.   Signs of a Stroke? Follow the BEFAST method:  Balance Watch for a sudden loss of balance, trouble with coordination or vertigo Eyes Is there a sudden loss of vision in one or both eyes? Or double vision?  Face: Ask the person to smile. Does one side of the face droop or is it numb?  Arms: Ask the person to raise both arms. Does one arm drift downward? Is there weakness or numbness of a leg? Speech: Ask the person to repeat a simple phrase. Does the speech sound slurred/strange? Is the person confused ? Time: If you observe any of these signs, call 911.         Thank you for coming to see us  at [facility]. I hope we have been able to provide you high quality care today.  You may receive a patient satisfaction survey over the next few weeks. We would appreciate your feedback and comments so that we may continue to improve ourselves and the health of our patients.

## 2024-07-18 DIAGNOSIS — E1165 Type 2 diabetes mellitus with hyperglycemia: Secondary | ICD-10-CM | POA: Diagnosis not present

## 2024-07-18 DIAGNOSIS — I10 Essential (primary) hypertension: Secondary | ICD-10-CM | POA: Diagnosis not present

## 2024-07-18 DIAGNOSIS — E785 Hyperlipidemia, unspecified: Secondary | ICD-10-CM | POA: Diagnosis not present

## 2024-07-18 DIAGNOSIS — Z9862 Peripheral vascular angioplasty status: Secondary | ICD-10-CM | POA: Diagnosis not present

## 2024-07-18 DIAGNOSIS — E1169 Type 2 diabetes mellitus with other specified complication: Secondary | ICD-10-CM | POA: Diagnosis not present

## 2024-07-18 DIAGNOSIS — I6523 Occlusion and stenosis of bilateral carotid arteries: Secondary | ICD-10-CM | POA: Diagnosis not present

## 2024-07-18 DIAGNOSIS — L282 Other prurigo: Secondary | ICD-10-CM | POA: Diagnosis not present

## 2024-07-18 DIAGNOSIS — Z8673 Personal history of transient ischemic attack (TIA), and cerebral infarction without residual deficits: Secondary | ICD-10-CM | POA: Diagnosis not present

## 2024-07-19 ENCOUNTER — Ambulatory Visit: Admitting: Vascular Surgery

## 2024-07-19 ENCOUNTER — Encounter: Payer: Self-pay | Admitting: Vascular Surgery

## 2024-07-19 ENCOUNTER — Ambulatory Visit (HOSPITAL_COMMUNITY)
Admission: RE | Admit: 2024-07-19 | Discharge: 2024-07-19 | Disposition: A | Discharge status: Home / Self Care | Source: Ambulatory Visit | Attending: Surgery | Admitting: Surgery

## 2024-07-19 VITALS — BP 138/76 | HR 93 | Temp 98.4°F | Resp 20 | Ht 64.0 in | Wt 215.4 lb

## 2024-07-19 DIAGNOSIS — Z95828 Presence of other vascular implants and grafts: Secondary | ICD-10-CM | POA: Insufficient documentation

## 2024-07-19 DIAGNOSIS — I6522 Occlusion and stenosis of left carotid artery: Secondary | ICD-10-CM | POA: Insufficient documentation

## 2024-07-19 NOTE — Progress Notes (Signed)
 Office Note     HPI: Hailey Johnson is a 78 y.o. (1945-11-06) female presenting status post 10/19/2023 TCAR for symptomatic left-sided ICA stenosis. Occasional well.  No complications.  On exam today, [name] was doing well.  She feels like she is back to her old self.  She still struggles with some speech related issues.  Denies recent TIA, stroke, amaurosis.  She has been compliant with her medications, but would like to get her cataracts removed when possible.  The pt is  on a statin for cholesterol management.  The pt is  on a daily aspirin .   Other AC:  plavix  The pt is  on medication for hypertension.   The pt is  diabetic.  Tobacco hx:  -  Past Medical History:  Diagnosis Date   Carotid artery occlusion    Diabetes mellitus without complication (HCC)    Stroke [facility])     Past Surgical History:  Procedure Laterality Date   COLONOSCOPY     TRANSCAROTID ARTERY REVASCULARIZATION  Left 06/18/2024   Procedure: LEFT TRANSCAROTID ARTERY REVASCULARIZATION;  Surgeon: Lanis Fonda BRAVO, MD;  Location: [facility] OR;  Service: Vascular;  Laterality: Left;   ULTRASOUND GUIDANCE FOR VASCULAR ACCESS Left 06/18/2024   Procedure: ULTRASOUND GUIDANCE, FOR VASCULAR ACCESS;  Surgeon: Lanis Fonda BRAVO, MD;  Location: [facility] OR;  Service: Vascular;  Laterality: Left;    Social History   Socioeconomic History   Marital status: Married    Spouse name: Not on file   Number of children: Not on file   Years of education: Not on file   Highest education level: Not on file  Occupational History   Not on file  Tobacco Use   Smoking status: Never   Smokeless tobacco: Never  Vaping Use   Vaping status: Never Used  Substance and Sexual Activity   Alcohol  use: Never   Drug use: Never   Sexual activity: Never  Other Topics Concern   Not on file  Social History Narrative   Not on file   Social Drivers of Health   Financial Resource Strain: Not on file  Food Insecurity: No Food Insecurity (06/18/2024)    Hunger Vital Sign    Worried About Running Out of Food in the Last Year: Never true    Ran Out of Food in the Last Year: Never true  Transportation Needs: No Transportation Needs (06/18/2024)   PRAPARE - Transportation    Lack of Transportation (Medical): No    Lack of Transportation (Non-Medical): No  Physical Activity: Not on file  Stress: Not on file  Social Connections: Unknown (06/18/2024)   Social Connection and Isolation Panel    Frequency of Communication with Friends and Family: More than three times a week    Frequency of Social Gatherings with Friends and Family: Three times a week    Attends Religious Services: Not on file    Active Member of Clubs or Organizations: Yes    Attends Club or Organization Meetings: 1 to 4 times per year    Marital Status: Married  Intimate Partner Violence: Unknown (06/18/2024)   Humiliation, Afraid, Rape, and Kick questionnaire    Fear of Current or Ex-Partner: No    Emotionally Abused: No    Physically Abused: Not on file    Sexually Abused: No   History reviewed. No pertinent family history.  Current Outpatient Medications  Medication Sig Dispense Refill   aspirin  81 MG chewable tablet Chew 1 tablet (81 mg total) by mouth daily.  30 tablet 1   clopidogrel  (PLAVIX ) 75 MG tablet Take 1 tablet (75 mg total) by mouth daily. 30 tablet 0   insulin  aspart (NOVOLOG ) 100 UNIT/ML FlexPen Inject into the skin before each meal 3 times a day based on blood glucose: 140-199 = 2 units, 200-250 = 4 units, 251-299 = 6 units,  300-349 = 8 units,  350 or above 10 units. 15 mL 0   insulin  glargine (LANTUS  SOLOSTAR) 100 UNIT/ML Solostar Pen Inject 18 Units total into the skin daily. 15 mL 0   Insulin  Pen Needle (TECHLITE PEN NEEDLES) 32G X 4 MM MISC Use 1 each to inject insulin  into the skin 4 (four) times daily. 100 each 0   rosuvastatin  (CRESTOR ) 20 MG tablet Take 1 tablet (20 mg total) by mouth daily. 30 tablet 0   sitaGLIPtin -metformin  (JANUMET ) 50-500 MG  tablet Take 2 tablets by mouth daily.     valsartan (DIOVAN) 80 MG tablet Take 80 mg by mouth daily.     No current facility-administered medications for this visit.    Allergies  Allergen Reactions   Antihistamines, Chlorpheniramine-Type Palpitations   Latex Dermatitis   Pseudoephedrine Hcl Palpitations     REVIEW OF SYSTEMS:   [X]  denotes positive finding, [ ]  denotes negative finding Cardiac  Comments:  Chest pain or chest pressure:    Shortness of breath upon exertion:    Short of breath when lying flat:    Irregular heart rhythm:        Vascular    Pain in calf, thigh, or hip brought on by ambulation:    Pain in feet at night that wakes you up from your sleep:     Blood clot in your veins:    Leg swelling:         Pulmonary    Oxygen at home:    Productive cough:     Wheezing:         Neurologic    Sudden weakness in arms or legs:     Sudden numbness in arms or legs:     Sudden onset of difficulty speaking or slurred speech:    Temporary loss of vision in one eye:     Problems with dizziness:         Gastrointestinal    Blood in stool:     Vomited blood:         Genitourinary    Burning when urinating:     Blood in urine:        Psychiatric    Major depression:         Hematologic    Bleeding problems:    Problems with blood clotting too easily:        Skin    Rashes or ulcers:        Constitutional    Fever or chills:      PHYSICAL EXAMINATION:  Vitals:   07/19/24 1458 07/19/24 1500  BP: 139/73 138/76  Pulse: 93   Resp: 20   Temp: 98.4 F (36.9 C)   TempSrc: Temporal   SpO2: 98%   Weight: 215 lb 6.4 oz (97.7 kg)   Height: 5' 4 (1.626 m)     General:  WDWN in NAD; vital signs documented above Gait: Not observed HENT: WNL, normocephalic Pulmonary: normal non-labored breathing , without wheezing Cardiac: regular HR Abdomen: soft, NT, no masses Skin: without rashes Vascular Exam/Pulses:  Right Left  Radial 2+ (normal) 2+  (normal)  Ulnar  Femoral    Popliteal    DP    PT     Extremities: without ischemic changes, without Gangrene , without cellulitis; without open wounds;  Musculoskeletal: no muscle wasting or atrophy  Neurologic: A&O X 3;  No focal weakness or paresthesias are detected, speech with small amount of words slur. No new deficits Psychiatric:  The pt has Normal affect.   Non-Invasive Vascular Imaging:     Right Carotid Findings:  +----------+--------+--------+--------+------------------+--------+           PSV cm/sEDV cm/sStenosisPlaque DescriptionComments  +----------+--------+--------+--------+------------------+--------+  CCA Prox  116     13                                          +----------+--------+--------+--------+------------------+--------+  CCA Distal68      13                                          +----------+--------+--------+--------+------------------+--------+  ICA Prox  85      15              heterogenous                +----------+--------+--------+--------+------------------+--------+  ICA Mid   113     21      1-39%   heterogenous                +----------+--------+--------+--------+------------------+--------+  ICA Distal74      19                                          +----------+--------+--------+--------+------------------+--------+  ECA      103     18              heterogenous                +----------+--------+--------+--------+------------------+--------+   +----------+--------+-------+----------------+-------------------+           PSV cm/sEDV cmsDescribe        Arm Pressure (mmHG)  +----------+--------+-------+----------------+-------------------+  [id number]    0      Multiphasic, WNL                     +----------+--------+-------+----------------+-------------------+   +---------+--------+--+--------+--+---------+  VertebralPSV cm/s51EDV cm/s11Antegrade   +---------+--------+--+--------+--+---------+     Left Carotid Findings:  +----------+--------+--------+--------+------------------+--------+           PSV cm/sEDV cm/sStenosisPlaque DescriptionComments  +----------+--------+--------+--------+------------------+--------+  CCA Prox  94      16                                          +----------+--------+--------+--------+------------------+--------+  CCA Distal69      18                                          +----------+--------+--------+--------+------------------+--------+  ECA      62      7                                           +----------+--------+--------+--------+------------------+--------+   +----------+--------+--------+----------------+-------------------+  PSV cm/sEDV cm/sDescribe        Arm Pressure (mmHG)  +----------+--------+--------+----------------+-------------------+  [id number]    0       Multiphasic, WNL                     +----------+--------+--------+----------------+-------------------+   +---------+--------+--+--------+--+---------+  VertebralPSV cm/s55EDV cm/s16Antegrade  +---------+--------+--+--------+--+---------+      Left Stent(s):  +------------------------+--------+--------+--------+--------+--------+  Distal CCA to Distal ICAPSV cm/sEDV cm/sStenosisWaveformComments  +------------------------+--------+--------+--------+--------+--------+  Prox to Stent           77      20                                +------------------------+--------+--------+--------+--------+--------+  Proximal Stent          65      21                                +------------------------+--------+--------+--------+--------+--------+  Mid Stent               42      12                                +------------------------+--------+--------+--------+--------+--------+  Distal Stent            66      18                                 +------------------------+--------+--------+--------+--------+--------+  Distal to Stent         66      20                                +------------------------+--------+--------+--------+--------+--------+         ASSESSMENT/PLAN: ANISIA LEIJA is a 78 y.o. female presenting status post 06/18/2024 left TCAR for symptomatic ICA stenosis. Patient doing well.  No new deficits.  Speech improving.  My plan is to see her in 9 months time.  I asked that she continue her current medication regimen in the interim which include aspirin , Plavix , high intensity statin.    Fonda FORBES Rim, MD [facility] [phone]

## 2024-07-27 DIAGNOSIS — M170 Bilateral primary osteoarthritis of knee: Secondary | ICD-10-CM | POA: Diagnosis not present

## 2024-07-27 DIAGNOSIS — I10 Essential (primary) hypertension: Secondary | ICD-10-CM | POA: Diagnosis not present

## 2024-07-27 DIAGNOSIS — E66812 Obesity, class 2: Secondary | ICD-10-CM | POA: Diagnosis not present

## 2024-07-27 DIAGNOSIS — E1169 Type 2 diabetes mellitus with other specified complication: Secondary | ICD-10-CM | POA: Diagnosis not present

## 2024-07-27 DIAGNOSIS — E1136 Type 2 diabetes mellitus with diabetic cataract: Secondary | ICD-10-CM | POA: Diagnosis not present

## 2024-07-27 DIAGNOSIS — E1165 Type 2 diabetes mellitus with hyperglycemia: Secondary | ICD-10-CM | POA: Diagnosis not present

## 2024-07-27 DIAGNOSIS — E785 Hyperlipidemia, unspecified: Secondary | ICD-10-CM | POA: Diagnosis not present

## 2024-08-26 DIAGNOSIS — M170 Bilateral primary osteoarthritis of knee: Secondary | ICD-10-CM | POA: Diagnosis not present

## 2024-08-26 DIAGNOSIS — E1169 Type 2 diabetes mellitus with other specified complication: Secondary | ICD-10-CM | POA: Diagnosis not present

## 2024-08-26 DIAGNOSIS — E785 Hyperlipidemia, unspecified: Secondary | ICD-10-CM | POA: Diagnosis not present

## 2024-08-26 DIAGNOSIS — E1136 Type 2 diabetes mellitus with diabetic cataract: Secondary | ICD-10-CM | POA: Diagnosis not present

## 2024-08-26 DIAGNOSIS — E1165 Type 2 diabetes mellitus with hyperglycemia: Secondary | ICD-10-CM | POA: Diagnosis not present

## 2024-08-26 DIAGNOSIS — I10 Essential (primary) hypertension: Secondary | ICD-10-CM | POA: Diagnosis not present

## 2024-08-26 DIAGNOSIS — E66812 Obesity, class 2: Secondary | ICD-10-CM | POA: Diagnosis not present
